# Patient Record
Sex: Male | Born: 1955 | Smoking: Former smoker
Health system: Southern US, Community
[De-identification: ages and names within clinical notes are randomized; demographics above are authoritative.]

## PROBLEM LIST (undated history)

## (undated) DIAGNOSIS — K859 Acute pancreatitis without necrosis or infection, unspecified: Secondary | ICD-10-CM

---

## 2013-05-02 ENCOUNTER — Encounter (HOSPITAL_COMMUNITY): Payer: Self-pay | Admitting: Emergency Medicine

## 2013-05-02 ENCOUNTER — Emergency Department (HOSPITAL_COMMUNITY)
Admission: EM | Admit: 2013-05-02 | Discharge: 2013-05-02 | Disposition: A | Discharge status: Home / Self Care | Payer: Non-veteran care | Attending: Emergency Medicine | Admitting: Emergency Medicine

## 2013-05-02 DIAGNOSIS — M7700 Medial epicondylitis, unspecified elbow: Secondary | ICD-10-CM | POA: Insufficient documentation

## 2013-05-02 DIAGNOSIS — M7702 Medial epicondylitis, left elbow: Secondary | ICD-10-CM

## 2013-05-02 LAB — CBC WITH DIFFERENTIAL/PLATELET
Basophils Absolute: 0 10*3/uL (ref 0.0–0.1)
Basophils Relative: 0 % (ref 0–1)
Eosinophils Absolute: 0.1 10*3/uL (ref 0.0–0.7)
Eosinophils Relative: 2 % (ref 0–5)
HCT: 43.5 % (ref 39.0–52.0)
Hemoglobin: 14.7 g/dL (ref 13.0–17.0)
Lymphocytes Relative: 33 % (ref 12–46)
Lymphs Abs: 1.7 10*3/uL (ref 0.7–4.0)
MCH: 31.1 pg (ref 26.0–34.0)
MCHC: 33.8 g/dL (ref 30.0–36.0)
MCV: 92.2 fL (ref 78.0–100.0)
Monocytes Absolute: 0.6 10*3/uL (ref 0.1–1.0)
Monocytes Relative: 11 % (ref 3–12)
Neutro Abs: 2.8 10*3/uL (ref 1.7–7.7)
Neutrophils Relative %: 54 % (ref 43–77)
Platelets: 155 10*3/uL (ref 150–400)
RBC: 4.72 MIL/uL (ref 4.22–5.81)
RDW: 13.1 % (ref 11.5–15.5)
WBC: 5.2 10*3/uL (ref 4.0–10.5)

## 2013-05-02 LAB — COMPREHENSIVE METABOLIC PANEL
ALT: 98 U/L — ABNORMAL HIGH (ref 0–53)
AST: 265 U/L — ABNORMAL HIGH (ref 0–37)
Albumin: 3.7 g/dL (ref 3.5–5.2)
Alkaline Phosphatase: 157 U/L — ABNORMAL HIGH (ref 39–117)
BUN: 19 mg/dL (ref 6–23)
CO2: 23 mEq/L (ref 19–32)
Calcium: 8.8 mg/dL (ref 8.4–10.5)
Chloride: 99 mEq/L (ref 96–112)
Creatinine, Ser: 0.97 mg/dL (ref 0.50–1.35)
GFR calc Af Amer: >90 mL/min (ref >90)
GFR calc non Af Amer: 90 mL/min — ABNORMAL LOW (ref >90)
Glucose, Bld: 130 mg/dL — ABNORMAL HIGH (ref 70–99)
Potassium: 4.1 mEq/L (ref 3.7–5.3)
Sodium: 139 mEq/L (ref 137–147)
Total Bilirubin: 0.4 mg/dL (ref 0.3–1.2)
Total Protein: 7.1 g/dL (ref 6.0–8.3)

## 2013-05-02 LAB — TROPONIN I: Troponin I: <0.3 ng/mL (ref <0.30)

## 2013-05-02 MED ORDER — IBUPROFEN 600 MG PO TABS: 600.0000 mg | ORAL_TABLET | Freq: Four times a day (QID) | ORAL | Status: DC | PRN

## 2013-05-02 NOTE — Discharge Instructions (Signed)
Medial Epicondylitis (Golfer's Elbow)  with Rehab  Medial epicondylitis involves inflammation and pain around the inner (medial) portion of the elbow. This pain is caused by inflammation of the tendons in the forearm that flex (bring down) the wrist. Medial epicondylitis is also called golfer's elbow, because it is common among golfers. However, it may occur in any individual who flexes the wrist regularly. If medial epicondylitis is left untreated, it may become a chronic problem.  SYMPTOMS   · Pain, tenderness, or inflammation over the inner (medial) side of the elbow.  · Pain or weakness with gripping activities.  · Pain that increases with wrist twisting motions (using a screwdriver, playing golf, bowling).  CAUSES   Medial epicondylitis is caused by inflammation of the tendons that flex the wrist. Causes of injury may include:  · Chronic, repetitive stress and strain to the tendons that run from the wrist and forearm to the elbow.  · Sudden strain on the forearm, including wrist snap when serving balls with racquet sports, or throwing a baseball.  RISK INCREASES WITH:  · Sports or occupations that require repetitive and/or strenuous forearm and wrist movements (pitching a baseball, golfing, carpentry).  · Poor wrist and forearm strength and flexibility.  · Failure to warm up properly before activity.  · Resuming activity before healing, rehabilitation, and conditioning are complete.  PREVENTION   · Warm up and stretch properly before activity.  · Maintain physical fitness:  · Strength, flexibility, and endurance.  · Cardiovascular fitness.  · Wear and use properly fitted equipment.  · Learn and use proper technique and have a coach correct improper technique.  · Wear a tennis elbow (counterforce) brace.  PROGNOSIS   The course of this condition depends on the degree of the injury. If treated properly, acute cases (symptoms lasting less than 4 weeks) are often resolved in 2 to 6 weeks. Chronic (longer lasting  cases) often resolve in 3 to 6 months, but may require physical therapy.  RELATED COMPLICATIONS   · Frequently recurring symptoms, resulting in a chronic problem. Properly treating the problem the first time decreases frequency of recurrence.  · Chronic inflammation, scarring, and partial tendon tear, requiring surgery.  · Delayed healing or resolution of symptoms.  TREATMENT   Treatment first involves the use of ice and medicine, to reduce pain and inflammation. Strengthening and stretching exercises may reduce discomfort, if performed regularly. These exercises may be performed at home, if the condition is an acute injury. Chronic cases may require a referral to a physical therapist for evaluation and treatment. Your caregiver may advise a corticosteroid injection to help reduce inflammation. Rarely, surgery is needed.  MEDICATION  · If pain medicine is needed, nonsteroidal anti-inflammatory medicines (aspirin and ibuprofen), or other minor pain relievers (acetaminophen), are often advised.  · Do not take pain medicine for 7 days before surgery.  · Prescription pain relievers may be given, if your caregiver thinks they are needed. Use only as directed and only as much as you need.  · Corticosteroid injections may be recommended. These injections should be reserved only for the most severe cases, because they can only be given a certain number of times.  HEAT AND COLD  · Cold treatment (icing) should be applied for 10 to 15 minutes every 2 to 3 hours for inflammation and pain, and immediately after activity that aggravates your symptoms. Use ice packs or an ice massage.  · Heat treatment may be used before performing stretching and strengthening activities   prescribed by your caregiver, physical therapist, or athletic trainer. Use a heat pack or a warm water soak.  SEEK MEDICAL CARE IF:  Symptoms get worse or do not improve in 2 weeks, despite treatment.  EXERCISES   RANGE OF MOTION (ROM) AND STRETCHING EXERCISES -  Epicondylitis, Medial (Golfer's Elbow)  These exercises may help you when beginning to rehabilitate your injury. Your symptoms may go away with or without further involvement from your physician, physical therapist or athletic trainer. While completing these exercises, remember:   · Restoring tissue flexibility helps normal motion to return to the joints. This allows healthier, less painful movement and activity.  · An effective stretch should be held for at least 30 seconds.  · A stretch should never be painful. You should only feel a gentle lengthening or release in the stretched tissue.  RANGE OF MOTION  Wrist Flexion, Active-Assisted  · Extend your right / left elbow with your fingers pointing down.*  · Gently pull the back of your hand towards you, until you feel a gentle stretch on the top of your forearm.  · Hold this position for __________ seconds.  Repeat __________ times. Complete this exercise __________ times per day.   *If directed by your physician, physical therapist or athletic trainer, complete this stretch with your elbow bent, rather than extended.  RANGE OF MOTION  Wrist Extension, Active-Assisted  · Extend your right / left elbow and turn your palm upwards.*  · Gently pull your palm and fingertips back, so your wrist extends and your fingers point more toward the ground.  · You should feel a gentle stretch on the inside of your forearm.  · Hold this position for __________ seconds.  Repeat __________ times. Complete this exercise __________ times per day.  *If directed by your physician, physical therapist or athletic trainer, complete this stretch with your elbow bent, rather than extended.  STRETCH  Wrist Extension   · Place your right / left fingertips on a tabletop leaving your elbow slightly bent. Your fingers should point backwards.  · Gently press your fingers and palm down onto the table, by straightening your elbow. You should feel a stretch on the inside of your forearm.  · Hold this  position for __________ seconds.  Repeat __________ times. Complete this stretch __________ times per day.   STRENGTHENING EXERCISES - Epicondylitis, Medial (Golfer's Elbow)  These exercises may help you when beginning to rehabilitate your injury. They may resolve your symptoms with or without further involvement from your physician, physical therapist or athletic trainer. While completing these exercises, remember:   · Muscles can gain both the endurance and the strength needed for everyday activities through controlled exercises.  · Complete these exercises as instructed by your physician, physical therapist or athletic trainer. Increase the resistance and repetitions only as guided.  · You may experience muscle soreness or fatigue, but the pain or discomfort you are trying to eliminate should never worsen during these exercises. If this pain does get worse, stop and make sure you are following the directions exactly. If the pain is still present after adjustments, discontinue the exercise until you can discuss the trouble with your caregiver.  STRENGTH Wrist Flexors  · Sit with your right / left forearm palm-up, and fully supported on a table or countertop. Your elbow should be resting below the height of your shoulder. Allow your wrist to extend over the edge of the surface.  · Loosely holding a __________ weight, or a piece   of rubber exercise band or tubing, slowly curl your hand up toward your forearm.  · Hold this position for __________ seconds. Slowly lower the wrist back to the starting position in a controlled manner.  Repeat __________ times. Complete this exercise __________ times per day.   STRENGTH  Wrist Extensors  · Sit with your right / left forearm palm-down and fully supported. Your elbow should be resting below the height of your shoulder. Allow your wrist to extend over the edge of the surface.  · Loosely holding a __________ weight, or a piece of rubber exercise band or tubing, slowly curl  your hand up toward your forearm.  · Hold this position for __________ seconds. Slowly lower the wrist back to the starting position in a controlled manner.  Repeat __________ times. Complete this exercise __________ times per day.   STRENGTH - Ulnar Deviators  · Stand with a ____________________ weight in your right / left hand, or sit while holding a rubber exercise band or tubing, with your healthy arm supported on a table or countertop.  · Move your wrist so that your pinkie travels toward your forearm and your thumb moves away from your forearm.  · Hold this position for __________ seconds and then slowly lower the wrist back to the starting position.  Repeat __________ times. Complete this exercise __________ times per day  STRENGTH - Grip   · Grasp a tennis ball, a dense sponge, or a large, rolled sock in your hand.  · Squeeze as hard as you can, without increasing any pain.  · Hold this position for __________ seconds. Release your grip slowly.  Repeat __________ times. Complete this exercise __________ times per day.   STRENGTH  Forearm Supinators   · Sit with your right / left forearm supported on a table, keeping your elbow below shoulder height. Rest your hand over the edge, palm down.  · Gently grip a hammer or a soup ladle.  · Without moving your elbow, slowly turn your palm and hand upward to a "thumbs-up" position.  · Hold this position for __________ seconds. Slowly return to the starting position.  Repeat __________ times. Complete this exercise __________ times per day.   STRENGTH  Forearm Pronators  · Sit with your right / left forearm supported on a table, keeping your elbow below shoulder height. Rest your hand over the edge, palm up.  · Gently grip a hammer or a soup ladle.  · Without moving your elbow, slowly turn your palm and hand upward to a "thumbs-up" position.  · Hold this position for __________ seconds. Slowly return to the starting position.  Repeat __________ times. Complete this  exercise __________ times per day.   Document Released: 01/04/2005 Document Revised: 03/29/2011 Document Reviewed: 04/18/2008  [employer]® Patient Information ©2014 [employer].

## 2013-05-02 NOTE — ED Notes (Signed)
The pt is here from out-of-town ti bury his father.

## 2013-05-02 NOTE — ED Notes (Signed)
The pt has had numbness in his lt arm since 0700am today.  He slept o the couch and he has a history of the same numbness but it usually goes away. He has some pain with movement also.  Speech clear gait steady no facial droop

## 2013-05-02 NOTE — ED Provider Notes (Signed)
CSN: [id number]     Arrival date & time 05/02/13  1722 History   First MD Initiated Contact with Patient 05/02/13 1814     Chief Complaint  Patient presents with  . Numbness     (Consider location/radiation/quality/duration/timing/severity/associated sxs/prior Treatment) HPI  This is a 58 y.o. male with no pertinent PMH presenting today with decreased sensation, numbness.  This has been going on for about a year now.  Deficit is located left small, ring fingers as well as medial aspect of the long finger.  It usually resolves after "shaking my arm."  However, it has persisted since 0700 today.  "I just can't feel as well."  No meds taken.  Radiates from medial aspect of elbow to aforementioned digits.  Positive for associated tenderness at the medial aspect of the left elbow.  Negative for HA, vision change, change in gait, change in speech, hearing change or weakness.  Negative for abdominal pain, CP, SOB, dizziness, or lightheadedness.  History reviewed. No pertinent past medical history. History reviewed. No pertinent past surgical history. No family history on file. History  Substance Use Topics  . Smoking status: Never Smoker   . Smokeless tobacco: Not on file  . Alcohol Use: Yes    Review of Systems  Constitutional: Negative for fever and chills.  HENT: Negative for facial swelling.   Eyes: Negative for pain and visual disturbance.  Respiratory: Negative for chest tightness and shortness of breath.   Cardiovascular: Negative for chest pain.  Gastrointestinal: Negative for nausea and vomiting.  Genitourinary: Negative for dysuria.  Musculoskeletal: Positive for arthralgias.  Neurological: Positive for numbness. Negative for headaches.  Psychiatric/Behavioral: Negative for behavioral problems.      Allergies  Review of patient's allergies indicates no known allergies.  Home Medications   Prior to Admission medications   Not on File   BP 131/66  Pulse 74  Temp(Src)  98.9 F (37.2 C)  Resp 18  Ht 6' (1.829 m)  Wt 252 lb (114.306 kg)  BMI 34.17 kg/m2  SpO2 99% Physical Exam  Constitutional: He is oriented to person, place, and time. He appears well-developed and well-nourished. No distress.  HENT:  Head: Normocephalic and atraumatic.  Mouth/Throat: No oropharyngeal exudate.  Eyes: Conjunctivae are normal. Pupils are equal, round, and reactive to light. No scleral icterus.  Neck: Normal range of motion. No tracheal deviation present. No thyromegaly present.  Cardiovascular: Normal rate, regular rhythm and normal heart sounds.  Exam reveals no gallop and no friction rub.   No murmur heard. Pulmonary/Chest: Effort normal and breath sounds normal. No stridor. No respiratory distress. He has no wheezes. He has no rales. He exhibits no tenderness.  Abdominal: Soft. He exhibits no distension and no mass. There is no tenderness. There is no rebound and no guarding.  Musculoskeletal: Normal range of motion. He exhibits no edema.  Neurological: He is alert and oriented to person, place, and time. He has normal strength. A sensory deficit (Decreased sensation in the distribution of the ulnar nerve from the elbow to the small, ring, and  long fingers) is present. No cranial nerve deficit. Coordination and gait normal. GCS eye subscore is 4. GCS verbal subscore is 5. GCS motor subscore is 6.  Reflex Scores:      Patellar reflexes are 2+ on the right side and 2+ on the left side. Skin: Skin is warm and dry. He is not diaphoretic.    ED Course  Procedures (including critical care time) Labs Review Labs  Reviewed  TROPONIN I    Imaging Review No results found.   EKG Interpretation   Date/Time:  Wednesday May 02 2013 18:00:05 EDT Ventricular Rate:  75 PR Interval:  158 QRS Duration: 94 QT Interval:  436 QTC Calculation: 486 R Axis:   57 Text Interpretation:  Normal sinus rhythm with sinus arrhythmia Abnormal  ECG Confirmed by Juleen ChinaKOHUT  MD, STEPHEN  ([id number]) on 05/02/2013 6:33:08 PM      MDM   Final diagnoses:  None    This is a 58 y.o. male with no pertinent PMH presenting today with decreased sensation, numbness.  This has been going on for about a year now.  Deficit is located left small, ring fingers as well as medial aspect of the long finger.  It usually resolves after "shaking my arm."  However, it has persisted since 0700 today.  "I just can't feel as well."  No meds taken.  Radiates from medial aspect of elbow to aforementioned digits.  Positive for associated tenderness at the medial aspect of the left elbow.  Negative for HA, vision change, change in gait, change in speech, hearing change or weakness.  Negative for abdominal pain, CP, SOB, dizziness, or lightheadedness.  Refer to above for EKG interpretation by Dr. Juleen ChinaKohut.  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  I do not believe this represents radiculopathy, ACS, PE, dissection, CVA, chest mass, or spinal lesion.  There is no further indication for further workup.  EKG is WNL and Mr. Jenean LindauRhodes has no ACS risk factors.  Pt stable for discharge, FU.  I have counseled him on epicondylitis as well as supportive care for such. All questions answered.  Return precautions given.  I have discussed case and care has been guided by my attending physician, Dr. Juleen ChinaKohut.  Loma BostonStirling Josselyn Harkins, MD 05/03/13 [phone]

## 2013-05-09 NOTE — ED Provider Notes (Signed)
I saw and evaluated the patient, reviewed the resident's note and I agree with the findings and plan.   EKG Interpretation   Date/Time:  Wednesday May 02 2013 18:00:05 EDT Ventricular Rate:  75 PR Interval:  158 QRS Duration: 94 QT Interval:  436 QTC Calculation: 486 R Axis:   57 Text Interpretation:  Normal sinus rhythm with sinus arrhythmia Abnormal  ECG Confirmed by Kalee Broxton  MD, Arnelle Nale ([id number]) on 05/02/2013 6:33:08 PM      58yM with L forearm/hand numbness. Clearly in ulnar nerve distribution.Neuro exam unremarkable. Very low suspicion for CVA. Plan antiinflammatories.   Raeford RazorStephen Marcellas Marchant, MD 05/09/13 [id number]

## 2013-07-11 ENCOUNTER — Inpatient Hospital Stay (HOSPITAL_COMMUNITY)
Admission: EM | Admit: 2013-07-11 | Discharge: 2013-07-14 | DRG: 439 | Disposition: A | Discharge status: Home / Self Care | Payer: Non-veteran care | Attending: Internal Medicine | Admitting: Internal Medicine

## 2013-07-11 ENCOUNTER — Emergency Department (HOSPITAL_COMMUNITY): Payer: Non-veteran care

## 2013-07-11 ENCOUNTER — Encounter (HOSPITAL_COMMUNITY): Payer: Self-pay | Admitting: Emergency Medicine

## 2013-07-11 DIAGNOSIS — G5622 Lesion of ulnar nerve, left upper limb: Secondary | ICD-10-CM

## 2013-07-11 DIAGNOSIS — K219 Gastro-esophageal reflux disease without esophagitis: Secondary | ICD-10-CM | POA: Diagnosis present

## 2013-07-11 DIAGNOSIS — Z87891 Personal history of nicotine dependence: Secondary | ICD-10-CM

## 2013-07-11 DIAGNOSIS — K859 Acute pancreatitis without necrosis or infection, unspecified: Principal | ICD-10-CM | POA: Diagnosis present

## 2013-07-11 DIAGNOSIS — K5900 Constipation, unspecified: Secondary | ICD-10-CM | POA: Diagnosis present

## 2013-07-11 DIAGNOSIS — E876 Hypokalemia: Secondary | ICD-10-CM | POA: Diagnosis not present

## 2013-07-11 DIAGNOSIS — F1020 Alcohol dependence, uncomplicated: Secondary | ICD-10-CM | POA: Diagnosis present

## 2013-07-11 DIAGNOSIS — F1010 Alcohol abuse, uncomplicated: Secondary | ICD-10-CM | POA: Diagnosis present

## 2013-07-11 DIAGNOSIS — E871 Hypo-osmolality and hyponatremia: Secondary | ICD-10-CM | POA: Diagnosis present

## 2013-07-11 DIAGNOSIS — K2920 Alcoholic gastritis without bleeding: Secondary | ICD-10-CM | POA: Diagnosis present

## 2013-07-11 DIAGNOSIS — G5620 Lesion of ulnar nerve, unspecified upper limb: Secondary | ICD-10-CM | POA: Diagnosis present

## 2013-07-11 DIAGNOSIS — I10 Essential (primary) hypertension: Secondary | ICD-10-CM | POA: Diagnosis present

## 2013-07-11 DIAGNOSIS — K852 Alcohol induced acute pancreatitis without necrosis or infection: Secondary | ICD-10-CM | POA: Diagnosis present

## 2013-07-11 HISTORY — DX: Acute pancreatitis without necrosis or infection, unspecified: K85.90

## 2013-07-11 LAB — URINALYSIS, ROUTINE W REFLEX MICROSCOPIC
Bilirubin Urine: NEGATIVE
GLUCOSE, UA: NEGATIVE mg/dL
KETONES UR: NEGATIVE mg/dL
Leukocytes, UA: NEGATIVE
Nitrite: NEGATIVE
PH: 6 (ref 5.0–8.0)
Protein, ur: NEGATIVE mg/dL
Specific Gravity, Urine: 1.021 (ref 1.005–1.030)
Urobilinogen, UA: 1 mg/dL (ref 0.0–1.0)

## 2013-07-11 LAB — CBC WITH DIFFERENTIAL/PLATELET
Basophils Absolute: 0 10*3/uL (ref 0.0–0.1)
Basophils Relative: 0 % (ref 0–1)
EOS PCT: 0 % (ref 0–5)
Eosinophils Absolute: 0 10*3/uL (ref 0.0–0.7)
HCT: 43.2 % (ref 39.0–52.0)
Hemoglobin: 15 g/dL (ref 13.0–17.0)
LYMPHS PCT: 11 % — AB (ref 12–46)
Lymphs Abs: 1 10*3/uL (ref 0.7–4.0)
MCH: 30.9 pg (ref 26.0–34.0)
MCHC: 34.7 g/dL (ref 30.0–36.0)
MCV: 89.1 fL (ref 78.0–100.0)
Monocytes Absolute: 1 10*3/uL (ref 0.1–1.0)
Monocytes Relative: 11 % (ref 3–12)
NEUTROS PCT: 78 % — AB (ref 43–77)
Neutro Abs: 7.3 10*3/uL (ref 1.7–7.7)
Platelets: 144 10*3/uL — ABNORMAL LOW (ref 150–400)
RBC: 4.85 MIL/uL (ref 4.22–5.81)
RDW: 13.2 % (ref 11.5–15.5)
WBC: 9.3 10*3/uL (ref 4.0–10.5)

## 2013-07-11 LAB — COMPREHENSIVE METABOLIC PANEL
ALK PHOS: 179 U/L — AB (ref 39–117)
ALT: 53 U/L (ref 0–53)
AST: 152 U/L — ABNORMAL HIGH (ref 0–37)
Albumin: 3.5 g/dL (ref 3.5–5.2)
BILIRUBIN TOTAL: 1.4 mg/dL — AB (ref 0.3–1.2)
BUN: 10 mg/dL (ref 6–23)
CHLORIDE: 97 meq/L (ref 96–112)
CO2: 23 mEq/L (ref 19–32)
Calcium: 9.2 mg/dL (ref 8.4–10.5)
Creatinine, Ser: 0.83 mg/dL (ref 0.50–1.35)
GLUCOSE: 141 mg/dL — AB (ref 70–99)
POTASSIUM: 4.4 meq/L (ref 3.7–5.3)
SODIUM: 135 meq/L — AB (ref 137–147)
Total Protein: 7.5 g/dL (ref 6.0–8.3)

## 2013-07-11 LAB — HEMOGLOBIN A1C
Hgb A1c MFr Bld: 6.2 % — ABNORMAL HIGH (ref <5.7)
MEAN PLASMA GLUCOSE: 131 mg/dL — AB (ref <117)

## 2013-07-11 LAB — CBC
HEMATOCRIT: 42.6 % (ref 39.0–52.0)
Hemoglobin: 15 g/dL (ref 13.0–17.0)
MCH: 32.1 pg (ref 26.0–34.0)
MCHC: 35.2 g/dL (ref 30.0–36.0)
MCV: 91 fL (ref 78.0–100.0)
PLATELETS: 127 10*3/uL — AB (ref 150–400)
RBC: 4.68 MIL/uL (ref 4.22–5.81)
RDW: 13.5 % (ref 11.5–15.5)
WBC: 8.5 10*3/uL (ref 4.0–10.5)

## 2013-07-11 LAB — RAPID URINE DRUG SCREEN, HOSP PERFORMED
Amphetamines: NOT DETECTED
BARBITURATES: NOT DETECTED
Benzodiazepines: NOT DETECTED
Cocaine: NOT DETECTED
OPIATES: NOT DETECTED
TETRAHYDROCANNABINOL: POSITIVE — AB

## 2013-07-11 LAB — I-STAT CG4 LACTIC ACID, ED: Lactic Acid, Venous: 2.11 mmol/L (ref 0.5–2.2)

## 2013-07-11 LAB — LIPID PANEL
CHOL/HDL RATIO: 6.6 ratio
CHOLESTEROL: 164 mg/dL (ref 0–200)
HDL: 25 mg/dL — ABNORMAL LOW (ref >39)
LDL CALC: 92 mg/dL (ref 0–99)
Triglycerides: 235 mg/dL — ABNORMAL HIGH (ref <150)
VLDL: 47 mg/dL — AB (ref 0–40)

## 2013-07-11 LAB — CREATININE, SERUM
Creatinine, Ser: 0.86 mg/dL (ref 0.50–1.35)
GFR calc Af Amer: >90 mL/min (ref >90)

## 2013-07-11 LAB — URINE MICROSCOPIC-ADD ON

## 2013-07-11 LAB — LIPASE, BLOOD: Lipase: 536 U/L — ABNORMAL HIGH (ref 11–59)

## 2013-07-11 LAB — ETHANOL
Alcohol, Ethyl (B): <11 mg/dL (ref 0–11)
Alcohol, Ethyl (B): <11 mg/dL (ref 0–11)

## 2013-07-11 MED ORDER — ENOXAPARIN SODIUM 40 MG/0.4ML ~~LOC~~ SOLN
40.0000 mg | SUBCUTANEOUS | Status: DC
  Administered 2013-07-11 – 2013-07-13 (×3): 40 mg via SUBCUTANEOUS
  Filled 2013-07-11 (×4): qty 0.4

## 2013-07-11 MED ORDER — SODIUM CHLORIDE 0.9 % IV SOLN
INTRAVENOUS | Status: DC
  Administered 2013-07-11 – 2013-07-13 (×7): via INTRAVENOUS

## 2013-07-11 MED ORDER — HYDROMORPHONE HCL PF 1 MG/ML IJ SOLN
1.0000 mg | Freq: Once | INTRAMUSCULAR | Status: AC
  Administered 2013-07-11: 1 mg via INTRAVENOUS
  Filled 2013-07-11: qty 1

## 2013-07-11 MED ORDER — HYDROCODONE-ACETAMINOPHEN 5-325 MG PO TABS
2.0000 | ORAL_TABLET | ORAL | Status: DC | PRN
  Administered 2013-07-11 – 2013-07-12 (×4): 2 via ORAL
  Filled 2013-07-11 (×5): qty 2

## 2013-07-11 MED ORDER — THIAMINE HCL 100 MG/ML IJ SOLN
100.0000 mg | Freq: Every day | INTRAMUSCULAR | Status: DC
  Administered 2013-07-13: 100 mg via INTRAVENOUS
  Filled 2013-07-11 (×3): qty 1

## 2013-07-11 MED ORDER — ONDANSETRON HCL 4 MG/2ML IJ SOLN
4.0000 mg | Freq: Once | INTRAMUSCULAR | Status: AC
  Administered 2013-07-11: 4 mg via INTRAVENOUS
  Filled 2013-07-11: qty 2

## 2013-07-11 MED ORDER — LORAZEPAM 2 MG/ML IJ SOLN: 1.0000 mg | Freq: Four times a day (QID) | INTRAMUSCULAR | Status: DC | PRN

## 2013-07-11 MED ORDER — ADULT MULTIVITAMIN W/MINERALS CH
1.0000 | ORAL_TABLET | Freq: Every day | ORAL | Status: DC
  Administered 2013-07-11 – 2013-07-14 (×4): 1 via ORAL
  Filled 2013-07-11 (×4): qty 1

## 2013-07-11 MED ORDER — CHLORTHALIDONE 25 MG PO TABS
25.0000 mg | ORAL_TABLET | Freq: Every day | ORAL | Status: DC
  Administered 2013-07-12 – 2013-07-14 (×3): 25 mg via ORAL
  Filled 2013-07-11 (×6): qty 1

## 2013-07-11 MED ORDER — FOLIC ACID 1 MG PO TABS
1.0000 mg | ORAL_TABLET | Freq: Every day | ORAL | Status: DC
  Administered 2013-07-11 – 2013-07-14 (×4): 1 mg via ORAL
  Filled 2013-07-11 (×4): qty 1

## 2013-07-11 MED ORDER — ONDANSETRON HCL 4 MG/2ML IJ SOLN: 4.0000 mg | Freq: Four times a day (QID) | INTRAMUSCULAR | Status: DC | PRN

## 2013-07-11 MED ORDER — SODIUM CHLORIDE 0.9 % IV BOLUS (SEPSIS)
500.0000 mL | Freq: Once | INTRAVENOUS | Status: AC
  Administered 2013-07-11: 500 mL via INTRAVENOUS

## 2013-07-11 MED ORDER — ONDANSETRON HCL 4 MG PO TABS
4.0000 mg | ORAL_TABLET | Freq: Four times a day (QID) | ORAL | Status: DC | PRN
  Administered 2013-07-12: 4 mg via ORAL
  Filled 2013-07-11: qty 1

## 2013-07-11 MED ORDER — VITAMIN B-1 100 MG PO TABS
100.0000 mg | ORAL_TABLET | Freq: Every day | ORAL | Status: DC
  Administered 2013-07-11 – 2013-07-14 (×3): 100 mg via ORAL
  Filled 2013-07-11 (×4): qty 1

## 2013-07-11 MED ORDER — HYDROMORPHONE HCL PF 1 MG/ML IJ SOLN
1.0000 mg | INTRAMUSCULAR | Status: AC | PRN
  Administered 2013-07-11 – 2013-07-12 (×4): 1 mg via INTRAVENOUS
  Filled 2013-07-11 (×4): qty 1

## 2013-07-11 MED ORDER — LORAZEPAM 1 MG PO TABS
1.0000 mg | ORAL_TABLET | Freq: Four times a day (QID) | ORAL | Status: AC | PRN
Start: 2013-07-11 — End: 2013-07-14

## 2013-07-11 NOTE — ED Notes (Signed)
Dr. Pollina at bedside   

## 2013-07-11 NOTE — H&P (Signed)
Date: 07/11/2013               Patient Name:  Brandon Camacho MRN: [id number]  DOB: 09-23-1955 Age / Sex: 58 y.o., male   PCP: No primary provider on file.         Medical Service: Internal Medicine Teaching Service         Attending Physician: Dr. Inez CatalinaEmily B Mullen, MD    First Contact: Dr. Clyde CanterburyE. Woody Jones Pager: [phone]  Second Contact: Dr. Burr Medico. Kazibwe Pager: [phone]       After Hours (After 5p/  First Contact Pager: [phone]  weekends / holidays): Second Contact Pager: [phone]   Chief Complaint: abdominal pain  History of Present Illness: Brandon Camacho is a 58 yo with history significant for hypertension, alcohol abuse, and prior alcoholic pancreatitis (2013) who presents to [facility] ED with complaints of abdominal pain for the past 2 days.  He reports that an achy, dull left upper quadrant pain is continuous and associated with nausea and vomiting if he "tries to eat".  [place] from [place] to handle the estate of his father who is recently deceased.  [place] [facility] and [facility] clinic in [place], [place](Kara Puti, MD-PCP). States that he is a daily drinker of beer and vodka.  Had difficulty quantifying but states "its the amount served in a bar, at least an ounce". Denies fever, vomiting blood, dark or bloody stools, diarrhea, seizures, syncopal episodes, chest pain or palpitations.  States that he has been without blood pressure medicine for some time because it was only "borderline" high.  Former smoker quit Nov 2014 Married, kids, full code Denies illicit drugs  Meds: Current Facility-Administered Medications  Medication Dose Route Frequency Provider Last Rate Last Dose  . chlorthalidone (HYGROTON) tablet 25 mg  25 mg Oral Daily Manuela SchwartzKaren-Akosua M Schooler, MD      . HYDROmorphone (DILAUDID) injection 1 mg  1 mg Intravenous Q4H PRN Manuela SchwartzKaren-Akosua M Schooler, MD       Current Outpatient  Prescriptions  Medication Sig Dispense Refill  . ibuprofen (ADVIL,MOTRIN) 200 MG tablet Take 400 mg by mouth every 6 (six) hours as needed (pain).        Allergies: Allergies as of 07/11/2013  . (No Known Allergies)   Past Medical History  Diagnosis Date  . Pancreatitis    History reviewed. No pertinent past surgical history. History reviewed. No pertinent family history. History   Social History  . Marital Status: Unknown    Spouse Name: N/A    Number of Children: N/A  . Years of Education: N/A   Occupational History  . Not on file.   Social History Main Topics  . Smoking status: Former Smoker    Quit date: 12/11/2012  . Smokeless tobacco: Not on file  . Alcohol Use: Yes  . Drug Use: No  . Sexual Activity: Not on file   Other Topics Concern  . Not on file   Social History Narrative   Veteran from [place], usually receives care at [facility] [place] and Satellite clinic [place], [place] (Dr. Alfonso PattenPuti)     Review of Systems: Constitutional:  Denies fever, chills, diaphoresis, appetite change and fatigue.  HEENT: Denies congestion, rhinorrhea or cold-like sx.  Respiratory: Denies SOB, DOE, cough, chest tightness, and wheezing.  Cardiovascular: Denies chest pain, palpitations and leg swelling.  Gastrointestinal: Endorses nausea, vomiting after eating "a  cracker", abdominal pain, Denies diarrhea, constipation, blood in stool and abdominal distention.  Genitourinary: Denies dysuria, hematuria, flank pain and difficulty urinating.  Musculoskeletal: Denies myalgias, back pain, joint swelling, arthralgias and gait problem.   Skin: Denies pallor, rash and wound.  Neurological: Reports ulnar compression with weakness of left 4th and 5th finger, waiting for EEG approval per [facility], Denies dizziness, seizures, syncope, weakness, light-headedness,  and headaches.   Hematological: Denies easy bruising  Psychiatric/ Behavioral: Denies confusion and agitation.     Physical  Exam: Blood pressure 175/94, pulse 69, temperature 98.6 F (37 C), temperature source Oral, resp. rate 13, height 6' (1.829 m), weight 240 lb (108.863 kg), SpO2 96.00%. General: Well-developed, well-nourished, AA male, in no acute distress; wife at bedside HEENT: Normocephalic, atraumatic, PERRLA, EOMI, murky sclera, Moist mucous membranes, supple neck, no carotid bruits, no JVD appreciated. Lungs: Normal respiratory effort. Clear to auscultation bilaterally from apices to bases without crackles or wheezes appreciated. Heart: normal rate, regular rhythm, normal S1 and S2, no gallop, murmur, or rubs appreciated. Abdomen: BS normoactive. Soft, Nondistended, tender to palpation epigastrium and LUQ, no guarding, no rebound  Extremities: No pretibial edema, distal pulses intact Neurologic: grossly non-focal, alert and oriented x3, appropriate and cooperative throughout examination. Strength 5/5 BUE and BLE   Lab results: Basic Metabolic Panel:  Recent Labs  16/10/96 0835  NA 135*  K 4.4  CL 97  CO2 23  GLUCOSE 141*  BUN 10  CREATININE 0.83  CALCIUM 9.2   Liver Function Tests:  Recent Labs  07/11/13 0835  AST 152*  ALT 53  ALKPHOS 179*  BILITOT 1.4*  PROT 7.5  ALBUMIN 3.5    Recent Labs  07/11/13 0835  LIPASE 536*   CBC:  Recent Labs  07/11/13 0835  WBC 9.3  NEUTROABS 7.3  HGB 15.0  HCT 43.2  MCV 89.1  PLT 144*   Urine Drug Screen: Drugs of Abuse  No results found for this basename: labopia, cocainscrnur, labbenz, amphetmu, thcu, labbarb    Urinalysis:  Recent Labs  07/11/13 0938  COLORURINE AMBER*  LABSPEC 1.021  PHURINE 6.0  GLUCOSEU NEGATIVE  HGBUR TRACE*  BILIRUBINUR NEGATIVE  KETONESUR NEGATIVE  PROTEINUR NEGATIVE  UROBILINOGEN 1.0  NITRITE NEGATIVE  LEUKOCYTESUR NEGATIVE    Imaging results:  Dg Abd Acute W/chest  07/11/2013   CLINICAL DATA:  Abdominal pain.  EXAM: ACUTE ABDOMEN SERIES (ABDOMEN 2 VIEW & CHEST 1 VIEW)  COMPARISON:  None.   FINDINGS: Soft tissues unremarkable. Calcifications and pelvis consistent with phleboliths. Gas pattern nonspecific. No bowel distention or free air.  Chest x-ray is unremarkable.  IMPRESSION: Negative abdominal radiographs.  No acute cardiopulmonary disease.   Electronically Signed   By: Maisie Fus  Register   On: 07/11/2013 09:08    Other results: EKG: normal sinus rhythm, prolonged QTc increased from .  Assessment & Plan by Problem: Mr. Zajicek is a 57 yo admitted with abdominal pain in setting of elevated lipase and alcohol abuse concerning for alcoholic pancreatitis.  #1 Acute Alcoholic Pancreatitis: recurrent with prior episode 2013, lipase 536. Consumes daily beer and hard liquor. Hemodynamically stable, No significant electrolytes abnormalities, normal calcium, slightly hyponatremic. Will manage volume status with IV fluids and analgesics. Pt received 2L NS in ED. Currently patient is fairly comfortable on exam and tolerating sips. -cont IV fluids 150 cc/h, monitor I&Os -NPO except sips with meds -IV Dilaudid 1 mg q4h prn x 4 doses -Hydrocodone 5-10 mg q4h prn breatkthrough pain   #2  Hypertension: bp 175/94 69 bpm, Pt states that he has only been "borderline" but does admit to taking antihypertensives in the past and has not refilled for some time.  Patient cannot recall prior medication names. Will start thiazide given no known Diabetes or CKD. -chlorthalidone 25 mg qd -check UDS -check HgbA1c   #3 Alcohol Abuse: Patient dose not think that he has a problem with drinking alcohol, no motivation to stop at this point. Patient counseled for cessation. -CIWA -offer information and resources for ETOH cessation during hospitalization, will attach additional info to Discharge Information  DVT ppx: Lovenox SQ FULL CODE  Dispo: Disposition is deferred at this time, awaiting improvement of current medical problems. Anticipated discharge in approximately 1-3 day(s).   The patient  does have a current PCP (No primary provider on file.) and does not need an [facility] hospital follow-up appointment after discharge.  The patient does not have transportation limitations that hinder transportation to clinic appointments.  Signed: Manuela SchwartzKaren-Akosua M Schooler, MD 07/11/2013, 12:07 PM

## 2013-07-11 NOTE — ED Notes (Signed)
Reports left side abd pain x 2 days with n/v. Hx of pancreatitis.

## 2013-07-11 NOTE — ED Provider Notes (Signed)
CSN: [id number]     Arrival date & time 07/11/13  [id number] History   First MD Initiated Contact with Patient 07/11/13 [phone]     Chief Complaint  Patient presents with  . Abdominal Pain     (Consider location/radiation/quality/duration/timing/severity/associated sxs/prior Treatment) HPI Comments: Patient presents to the ER for evaluation of abdominal pain. Patient reports that symptoms began 2 days ago. Patient now experiencing severe pain in the left upper abdomen with nausea and vomiting. Vomiting began this morning. Patient has not had any fever. There is no diarrhea or constipation. Patient reports a previous history of pancreatitis with similar symptoms. Patient indicates that his back it is in the process secondary to alcohol. He admits to continuing drinking.  Patient is a 58 y.o. male presenting with abdominal pain.  Abdominal Pain Associated symptoms: nausea and vomiting   Associated symptoms: no fever     Past Medical History  Diagnosis Date  . Pancreatitis    History reviewed. No pertinent past surgical history. History reviewed. No pertinent family history. History  Substance Use Topics  . Smoking status: Current Every Day Smoker  . Smokeless tobacco: Not on file  . Alcohol Use: Yes    Review of Systems  Constitutional: Negative for fever.  Gastrointestinal: Positive for nausea, vomiting and abdominal pain.  All other systems reviewed and are negative.     Allergies  Review of patient's allergies indicates no known allergies.  Home Medications   Prior to Admission medications   Medication Sig Start Date End Date Taking? Authorizing Provider  ibuprofen (ADVIL,MOTRIN) 200 MG tablet Take 400 mg by mouth every 6 (six) hours as needed (pain).   Yes Historical Provider, MD   BP 191/85  Pulse 65  Temp(Src) 98.6 F (37 C) (Oral)  Resp 15  Ht 6' (1.829 m)  Wt 240 lb (108.863 kg)  BMI 32.54 kg/m2  SpO2 99% Physical Exam  Constitutional: He is oriented to person,  place, and time. He appears well-developed and well-nourished. He appears distressed.  HENT:  Head: Normocephalic and atraumatic.  Right Ear: Hearing normal.  Left Ear: Hearing normal.  Nose: Nose normal.  Mouth/Throat: Oropharynx is clear and moist and mucous membranes are normal.  Eyes: Conjunctivae and EOM are normal. Pupils are equal, round, and reactive to light.  Neck: Normal range of motion. Neck supple.  Cardiovascular: Regular rhythm, S1 normal and S2 normal.  Exam reveals no gallop and no friction rub.   No murmur heard. Pulmonary/Chest: Effort normal and breath sounds normal. No respiratory distress. He exhibits no tenderness.  Abdominal: Soft. Normal appearance and bowel sounds are normal. There is no hepatosplenomegaly. There is tenderness in the left upper quadrant. There is no rebound, no guarding, no tenderness at McBurney's point and negative Murphy's sign. No hernia.  Musculoskeletal: Normal range of motion.  Neurological: He is alert and oriented to person, place, and time. He has normal strength. No cranial nerve deficit or sensory deficit. Coordination normal. GCS eye subscore is 4. GCS verbal subscore is 5. GCS motor subscore is 6.  Skin: Skin is warm, dry and intact. No rash noted. No cyanosis.  Psychiatric: He has a normal mood and affect. His speech is normal and behavior is normal. Thought content normal.    ED Course  Procedures (including critical care time) Labs Review Labs Reviewed  CBC WITH DIFFERENTIAL - Abnormal; Notable for the following:    Platelets 144 (*)    Neutrophils Relative % 78 (*)    Lymphocytes Relative  11 (*)    All other components within normal limits  COMPREHENSIVE METABOLIC PANEL - Abnormal; Notable for the following:    Sodium 135 (*)    Glucose, Bld 141 (*)    AST 152 (*)    Alkaline Phosphatase 179 (*)    Total Bilirubin 1.4 (*)    All other components within normal limits  LIPASE, BLOOD - Abnormal; Notable for the following:     Lipase 536 (*)    All other components within normal limits  URINALYSIS, ROUTINE W REFLEX MICROSCOPIC - Abnormal; Notable for the following:    Color, Urine AMBER (*)    APPearance CLOUDY (*)    Hgb urine dipstick TRACE (*)    All other components within normal limits  URINE MICROSCOPIC-ADD ON - Abnormal; Notable for the following:    Casts HYALINE CASTS (*)    All other components within normal limits  I-STAT CG4 LACTIC ACID, ED    Imaging Review Dg Abd Acute W/chest  07/11/2013   CLINICAL DATA:  Abdominal pain.  EXAM: ACUTE ABDOMEN SERIES (ABDOMEN 2 VIEW & CHEST 1 VIEW)  COMPARISON:  None.  FINDINGS: Soft tissues unremarkable. Calcifications and pelvis consistent with phleboliths. Gas pattern nonspecific. No bowel distention or free air.  Chest x-ray is unremarkable.  IMPRESSION: Negative abdominal radiographs.  No acute cardiopulmonary disease.   Electronically Signed   By: Maisie Fushomas  Register   On: 07/11/2013 09:08     EKG Interpretation   Date/Time:  Wednesday July 11 2013 08:40:03 EDT Ventricular Rate:  66 PR Interval:  148 QRS Duration: 88 QT Interval:  486 QTC Calculation: 509 R Axis:   11 Text Interpretation:  Sinus rhythm Prolonged QT interval No significant  change since last tracing Confirmed by POLLINA  MD, CHRISTOPHER ([id number]) on  07/11/2013 9:07:02 AM      MDM   Final diagnoses:  None   acute pancreatitis  Patient presents to the ER for evaluation of left-sided abdominal pain. Patient has a previous history of alcoholic pancreatitis, he admits to continuing drinking. Patient has tenderness in the left quadrant without signs of peritonitis. CBC is unremarkable. Does have mildly elevated AST and alkaline phosphatase, felt to be secondary to his alcoholic intake. Lipase is elevated at 536. This is consistent with recurrent pancreatitis. Patient requiring multiple doses of analgesia here in the ER, will be admitted for further management.    Gilda Creasehristopher J.  Pollina, MD 07/11/13 [phone]

## 2013-07-12 DIAGNOSIS — G5620 Lesion of ulnar nerve, unspecified upper limb: Secondary | ICD-10-CM

## 2013-07-12 LAB — BASIC METABOLIC PANEL
BUN: 8 mg/dL (ref 6–23)
CALCIUM: 8.9 mg/dL (ref 8.4–10.5)
CO2: 25 meq/L (ref 19–32)
CREATININE: 0.89 mg/dL (ref 0.50–1.35)
Chloride: 97 mEq/L (ref 96–112)
GFR calc Af Amer: >90 mL/min (ref >90)
GFR calc non Af Amer: >90 mL/min (ref >90)
Glucose, Bld: 103 mg/dL — ABNORMAL HIGH (ref 70–99)
Potassium: 3.4 mEq/L — ABNORMAL LOW (ref 3.7–5.3)
Sodium: 136 mEq/L — ABNORMAL LOW (ref 137–147)

## 2013-07-12 LAB — CBC
HEMATOCRIT: 43.3 % (ref 39.0–52.0)
Hemoglobin: 14.5 g/dL (ref 13.0–17.0)
MCH: 31.3 pg (ref 26.0–34.0)
MCHC: 33.5 g/dL (ref 30.0–36.0)
MCV: 93.5 fL (ref 78.0–100.0)
Platelets: 124 10*3/uL — ABNORMAL LOW (ref 150–400)
RBC: 4.63 MIL/uL (ref 4.22–5.81)
RDW: 13.9 % (ref 11.5–15.5)
WBC: 18.1 10*3/uL — ABNORMAL HIGH (ref 4.0–10.5)

## 2013-07-12 MED ORDER — POTASSIUM CHLORIDE 10 MEQ/100ML IV SOLN
10.0000 meq | INTRAVENOUS | Status: AC
  Administered 2013-07-12 (×4): 10 meq via INTRAVENOUS
  Filled 2013-07-12 (×4): qty 100

## 2013-07-12 MED ORDER — PANTOPRAZOLE SODIUM 40 MG IV SOLR
40.0000 mg | Freq: Every day | INTRAVENOUS | Status: DC
  Administered 2013-07-12 – 2013-07-13 (×2): 40 mg via INTRAVENOUS
  Filled 2013-07-12 (×3): qty 40

## 2013-07-12 MED ORDER — FLEET ENEMA 7-19 GM/118ML RE ENEM
1.0000 | ENEMA | Freq: Once | RECTAL | Status: AC
  Administered 2013-07-12: 20:00:00 via RECTAL
  Filled 2013-07-12 (×2): qty 1

## 2013-07-12 MED ORDER — PROMETHAZINE HCL 25 MG/ML IJ SOLN: 25.0000 mg | Freq: Four times a day (QID) | INTRAMUSCULAR | Status: DC | PRN

## 2013-07-12 MED ORDER — HYDROMORPHONE HCL PF 1 MG/ML IJ SOLN
1.0000 mg | INTRAMUSCULAR | Status: DC | PRN
  Administered 2013-07-12 (×3): 1 mg via INTRAVENOUS
  Filled 2013-07-12 (×3): qty 1

## 2013-07-12 MED ORDER — HYDROMORPHONE HCL PF 1 MG/ML IJ SOLN
1.0000 mg | INTRAMUSCULAR | Status: DC | PRN
Start: 2013-07-12 — End: 2013-07-13
  Administered 2013-07-12 – 2013-07-13 (×5): 1 mg via INTRAVENOUS
  Filled 2013-07-12 (×5): qty 1

## 2013-07-12 NOTE — H&P (Signed)
  Date: 07/12/2013  Patient name: Brandon Camacho  Medical record number: [id number]  Date of birth: [birth year]   I have seen and evaluated Brandon Idolerry Hamidi and discussed their care with the Residency Team.  Briefly, Mr. Jenean LindauRhodes is a 58yo man with PMH of HTN, ETOH abuse and pancreatitis in 2013 who presents with abdominal pain, LUQ, achy and associated with nausea and vomiting.  He is from [place] and on Monday he was noted to eat [employer] after which he developed a mild pain that gradually worsened.  The next morning he and his wife traveled from [place] as his father recently died.  On day of admission, his symptoms became unbearable and he presented.  He is an every day drinker of beer and vodka.  He has had no hemetemesis or melena, no syncope, chest pain.  He further notes decreased bowel movements and passage of flatus.  On exam, he is alert and awake, RR, NR, no murmur.  Abdomen is tender to palpation, worse in the left quadrants and with some voluntary guarding, no distention.  Legs are not edematous.  Labs revealed a mildly low Na, elevated AST to 152 and a lipase of 536.  Normal WBC.  AXR was unremarkable.    Assessment and Plan: I have seen and evaluated the patient as outlined above. I agree with the formulated Assessment and Plan as detailed in the residents' admission note, with the following changes:   1. Acute alcoholic pancreatitis - IVF with NS - NPO x meds - IV dilaudid while not taking PO, hydrocodone/tylenol for breakthrough if tolerated - Consider phenergan for nausea given prolonged QTc on EKG.  - Advance diet as tolerated - CIWA  2. HTN - Start chlorthalidone as noted in resident note  [facility] will [facility] [place] on his return home.   Inez CatalinaEmily B Mullen, MD 6/25/20152:09 PM

## 2013-07-12 NOTE — Discharge Summary (Signed)
Name: Brandon Camacho MRN: [id number] DOB: February 03, 1955 58 y.o. PCP: No primary provider on file. _________________________________________________  Date of Admission: 07/11/2013  8:17 AM Date of Discharge: 07/14/2013 Attending Physician: Inez CatalinaEmily B Mullen, MD   Discharge Diagnosis: Acute recurrent pancreatitis  Hypertension  Alcohol Abuse  Constipation    Discharge Medications:   Medication List    STOP taking these medications       ibuprofen 200 MG tablet  Commonly known as:  ADVIL,MOTRIN      TAKE these medications       chlorthalidone 25 MG tablet  Commonly known as:  HYGROTON  Take 1 tablet (25 mg total) by mouth daily.     HYDROcodone-acetaminophen 5-325 MG per tablet  Commonly known as:  NORCO/VICODIN  Take 1-2 tablets by mouth every 6 (six) hours as needed for moderate pain.     multivitamin with minerals Tabs tablet  Take 1 tablet by mouth daily.     pantoprazole 40 MG tablet  Commonly known as:  PROTONIX  Take 1 tablet (40 mg total) by mouth daily.        Disposition and follow-up:   BrandonWinfrey Brandon Camacho was [facility] in good condition to home.  Please address the following problems post-discharge:  1.Resolution of abdominal pain, pancreatitis 2.Counseling for alcohol cessation 3.BP control 4.Health maintenance 5.Resolution of constipation   Labs / imaging needed at time of follow-up: BMP, cbc  Pending labs/ test needing follow-up: None Follow-up Appointments: Please call your primary care physician for a hospital follow-up appointment as soon as possible.    Discharge Instructions: Discharge Instructions   Call MD for:  persistant nausea and vomiting    Complete by:  As directed      Call MD for:  severe uncontrolled pain    Complete by:  As directed      Diet - low sodium heart healthy    Complete by:  As directed      Discharge instructions    Complete by:  As directed   Please follow-up with your primary  care physician.     Increase activity slowly    Complete by:  As directed            Consultations:  None  Procedures Performed:  Dg Abd Acute W/chest  07/11/2013   CLINICAL DATA:  Abdominal pain.  EXAM: ACUTE ABDOMEN SERIES (ABDOMEN 2 VIEW & CHEST 1 VIEW)  COMPARISON:  None.  FINDINGS: Soft tissues unremarkable. Calcifications and pelvis consistent with phleboliths. Gas pattern nonspecific. No bowel distention or free air.  Chest x-ray is unremarkable.  IMPRESSION: Negative abdominal radiographs.  No acute cardiopulmonary disease.   Electronically Signed   By: Brandon Camacho  Register   On: 07/11/2013 09:08    2D Echo: N/A  Cardiac Cath: N/A  Admission HPI:  Mr. Brandon Camacho is a 58 yo with history significant for hypertension, alcohol abuse, and prior alcoholic pancreatitis (2013) who presents [facility] ED with complaints of abdominal pain for the past 2 days. He reports that an achy, dull left upper quadrant pain is continuous and associated with nausea and vomiting if he "tries to eat". He [place] from [place] to handle the estate of his father who is recently deceased. [place] [facility] [place] [facility] and [facility] clinic in [place], [place](Kara Puti, MD-PCP).  States that he is a daily drinker of beer and vodka. Had difficulty quantifying but  states "its the amount served in a bar, at least an ounce". Denies fever, vomiting blood, dark or bloody stools, diarrhea, seizures, syncopal episodes, chest pain or palpitations. States that he has been without blood pressure medicine for some time because it was only "borderline" high.  Former smoker quit Nov 2014  Married, kids, full code  Denies illicit drugs  Manuela Schwartz, MD  Hospital Course by problem list: Active Problems:   Acute alcoholic pancreatitis   Alcohol abuse   Hypertension   Ulnar nerve compression   Acute recurrent pancreatitis  Pt visiting from  [place] admitted with abdominal pain associated with N/V with lipase of 536.  Had a prior episode of pancreatitis in 2013.   Consumes daily beer and hard liquor. Hemodynamically stable, without significant electrolytes abnormalities, normal calcium, slightly hyponatremic and hypokalemic (repleted).  He was kept NPO and given IVF and IV analgesics until resolution of pain and able to tolerate food.  Also with leukocytosis likely d/t inflammation. VSS without tachycardia, afebrile. Alcoholic gastritis may also be contributing to pain as pt reports increased belching and hiccups.  He was started on protonix daily for possible gastritis.  His diet was advanced slowly and was tolerating a regular diet on the day of discharge.  He should follow-up with the [place] in [place].    Hypertension Pt reports having "borderline" HTN but does admit to taking antihypertensives in the past and has not refilled for some time and cannot recall names. Began chlorthalidone 25mg  daily during hospitalization.  Pain may also be contributing. May need to consider adding another agent pending resolution of pancreatitis. HA1c: 6.2. LP with elevated trig: 235 and low HDL: 25. LDL: 92.   Alcohol Abuse  Pt does not think that he has a problem with drinking alcohol, no motivation to stop at this point. Counseled for cessation. Also with elevated AST. Placed on CIWA and required no ativan.   Constipation  Pt with no BM since Monday and is not passing gas. Ab XR neg.  He was given enema with no BM.  Also given dose of miralax on day of discharge.     -water tap enema Discharge Vitals:   BP 140/74  Pulse 92  Temp(Src) 98.8 F (37.1 C) (Oral)  Resp 17  Ht 6' (1.829 m)  Wt 238 lb 5.1 oz (108.1 kg)  BMI 32.31 kg/m2  SpO2 99%  Discharge Labs:  Results for orders placed during the hospital encounter of 07/11/13 (from the past 24 hour(s))  CBC     Status: Abnormal   Collection Time    07/14/13  4:42 AM      Result Value Ref Range     WBC 13.8 (*) 4.0 - 10.5 K/uL   RBC 4.07 (*) 4.22 - 5.81 MIL/uL   Hemoglobin 12.8 (*) 13.0 - 17.0 g/dL   HCT 16.1 (*) 09.6 - 04.5 %   MCV 93.6  78.0 - 100.0 fL   MCH 31.4  26.0 - 34.0 pg   MCHC 33.6  30.0 - 36.0 g/dL   RDW 40.9  81.1 - 91.4 %   Platelets 125 (*) 150 - 400 K/uL  COMPREHENSIVE METABOLIC PANEL     Status: Abnormal   Collection Time    07/14/13  4:42 AM      Result Value Ref Range   Sodium 136 (*) 137 - 147 mEq/L   Potassium 3.2 (*) 3.7 - 5.3 mEq/L   Chloride 96  96 - 112 mEq/L   CO2  28  19 - 32 mEq/L   Glucose, Bld 89  70 - 99 mg/dL   BUN 10  6 - 23 mg/dL   Creatinine, Ser 1.610.77  0.50 - 1.35 mg/dL   Calcium 8.5  8.4 - 09.610.5 mg/dL   Total Protein 6.1  6.0 - 8.3 g/dL   Albumin 2.2 (*) 3.5 - 5.2 g/dL   AST 31  0 - 37 U/L   ALT 17  0 - 53 U/L   Alkaline Phosphatase 190 (*) 39 - 117 U/L   Total Bilirubin 1.1  0.3 - 1.2 mg/dL   GFR calc non Af Amer >90  >90 mL/min   GFR calc Af Amer >90  >90 mL/min    Signed: Marrian SalvageJacquelyn S Gill, MD 07/14/2013, 1:57 PM   Services Ordered on Discharge: None Equipment Ordered on Discharge: None

## 2013-07-12 NOTE — Progress Notes (Addendum)
Subjective:   Day of hospitalization: 1  VSS.  No overnight events.  Pt is feeling better this AM but pain is still 5/10, diffuse and constant without radiation.   Nausea but no vomiting.  No CP.  Some SOB.  He has not had a BM since Monday which is unusual for him and denies passing gas.  He reports dilaudid helps with the pain, he received 5 doses of dilaudid and 4 doses of norco.  Last dose 1038.  Also with h/o GERD. [facility] which he reports he never usually does and the pain began at that time.  States this does not feel exactly like the previous flare of pancreatitis.  No urinary symptoms.  He is +2.1L since admission.     Objective:   Vital signs in last 24 hours: Filed Vitals:   07/11/13 1705 07/11/13 1708 07/11/13 2114 07/12/13 0459  BP: 182/87 168/81 170/79 155/82  Pulse: 100 97 71 95  Temp:   98.5 F (36.9 C) 99.5 F (37.5 C)  TempSrc:   Oral Oral  Resp:   18 18  Height:   6' (1.829 m)   Weight:   240 lb (108.863 kg)   SpO2:   99% 95%    Weight: Filed Weights   07/11/13 0823 07/11/13 2114  Weight: 240 lb (108.863 kg) 240 lb (108.863 kg)    I/Os:  Intake/Output Summary (Last 24 hours) at 07/12/13 0941 Last data filed at 07/12/13 0600  Gross per 24 hour  Intake   2500 ml  Output    400 ml  Net   2100 ml    Physical Exam: Constitutional: Vital signs reviewed.  Patient is sitting lying in bed in mild distress but cooperative with exam.   HEENT: [place]/AT; PERRL, EOMI, conjunctivae normal, mild scleral icterus  Cardiovascular: RRR, no MRG Pulmonary/Chest: normal respiratory effort, no accessory muscle use, CTAB, no wheezes, rales, or rhonchi Abdominal: Soft. decreased BS, diffuse tenderness more localized in the epigastrium and LLQ, no guarding or rebound  Neurological: A&O x3, CN II-XII grossly intact; non-focal exam Extremities: 2+DP b/l, no C/C/E  Skin: Warm, dry and intact. No rash  Lab Results:  BMP:  Recent Labs Lab  07/11/13 0835 07/11/13 1410 07/12/13 0440  NA 135*  --  136*  K 4.4  --  3.4*  CL 97  --  97  CO2 23  --  25  GLUCOSE 141*  --  103*  BUN 10  --  8  CREATININE 0.83 0.86 0.89  CALCIUM 9.2  --  8.9   Anion Gap:  15-->14  CBC:  Recent Labs Lab 07/11/13 0835 07/11/13 1410 07/12/13 0440  WBC 9.3 8.5 18.1*  NEUTROABS 7.3  --   --   HGB 15.0 15.0 14.5  HCT 43.2 42.6 43.3  MCV 89.1 91.0 93.5  PLT 144* 127* 124*    Coagulation: No results found for this basename: LABPROT, INR,  in the last 168 hours  CBG:           No results found for this basename: GLUCAP,  in the last 168 hours         HA1C:       Recent Labs Lab 07/11/13 1410  HGBA1C 6.2*    Lipid Panel:  Recent Labs Lab 07/11/13 1410  CHOL 164  HDL 25*  LDLCALC 92  TRIG 161235*  CHOLHDL 6.6    LFTs:  Recent Labs Lab 07/11/13 0835  AST 152*  ALT 53  ALKPHOS 179*  BILITOT 1.4*  PROT 7.5  ALBUMIN 3.5    Pancreatic Enzymes:  Recent Labs Lab 07/11/13 0835  LIPASE 536*    Lactic Acid/Procalcitonin:  Recent Labs Lab 07/11/13 0852  LATICACIDVEN 2.11    Ammonia: No results found for this basename: AMMONIA,  in the last 168 hours  Cardiac Enzymes: No results found for this basename: CKTOTAL, CKMB, CKMBINDEX, TROPONINI,  in the last 168 hours  EKG: EKG Interpretation  Date/Time:  Wednesday July 11 2013 08:40:03 EDT Ventricular Rate:  66 PR Interval:  148 QRS Duration: 88 QT Interval:  486 QTC Calculation: 509 R Axis:   11 Text Interpretation:  Sinus rhythm Prolonged QT interval No significant change since last tracing Confirmed by POLLINA  MD, CHRISTOPHER [phone]([id number]) on 07/11/2013 9:07:02 AM   BNP: No results found for this basename: PROBNP,  in the last 168 hours  D-Dimer: No results found for this basename: DDIMER,  in the last 168 hours  Urinalysis:  Recent Labs Lab 07/11/13 0938  COLORURINE AMBER*  LABSPEC 1.021  PHURINE 6.0  GLUCOSEU NEGATIVE  HGBUR TRACE*   BILIRUBINUR NEGATIVE  KETONESUR NEGATIVE  PROTEINUR NEGATIVE  UROBILINOGEN 1.0  NITRITE NEGATIVE  LEUKOCYTESUR NEGATIVE    Micro Results: No results found for this or any previous visit (from the past 240 hour(s)).  Blood Culture: No results found for this basename: sdes,  specrequest,  cult,  reptstatus    Studies/Results: Dg Abd Acute W/chest  07/11/2013   CLINICAL DATA:  Abdominal pain.  EXAM: ACUTE ABDOMEN SERIES (ABDOMEN 2 VIEW & CHEST 1 VIEW)  COMPARISON:  None.  FINDINGS: Soft tissues unremarkable. Calcifications and pelvis consistent with phleboliths. Gas pattern nonspecific. No bowel distention or free air.  Chest x-ray is unremarkable.  IMPRESSION: Negative abdominal radiographs.  No acute cardiopulmonary disease.   Electronically Signed   By: Maisie Fushomas  Register   On: 07/11/2013 09:08    Medications:  Scheduled Meds: . chlorthalidone  25 mg Oral Daily  . enoxaparin (LOVENOX) injection  40 mg Subcutaneous Q24H  . folic acid  1 mg Oral Daily  . multivitamin with minerals  1 tablet Oral Daily  . potassium chloride  10 mEq Intravenous Q1 Hr x 4  . thiamine  100 mg Oral Daily   Or  . thiamine  100 mg Intravenous Daily   Continuous Infusions: . sodium chloride 150 mL/hr at 07/12/13 0515   PRN Meds: HYDROmorphone (DILAUDID) injection, LORazepam, LORazepam, ondansetron (ZOFRAN) IV, ondansetron  Antibiotics: Antibiotics Given (last 72 hours)   None      Day of Hospitalization: 1  Consults:    Assessment/Plan:   Active Problems:   Acute alcoholic pancreatitis   Alcohol abuse   Hypertension   Ulnar nerve compression  Acute recurrent alcoholic pancreatitis Pt feeling better this AM with pain 5/10.  Had prior episodes 2013.  Lipase 536. Consumes daily beer and hard liquor.  Hemodynamically stable, no significant electrolyte abnormalities, nl calcium, mildy hyponatremic. Will manage volume status with IV fluids and analgesics. Pt received 2L NS in ED.  Also with  leukocytosis likely d/t inflammation; pt meets SIR criteria.  Normal BUN.  VSS without tachycardia, afebrile.  Alcoholic gastritis may also be contributing to pain as pt reports increased belching.  -cont IV fluids 150 cc/h, monitor I&Os  -cont NPO except sips with meds  -IV Dilaudid 1 mg q4h prn -d/c hydrocodone 5-10 mg q4h prn  -protonix 40mg  IV qd  Hypertension  Pt reports having "borderline" HTN but does admit to taking antihypertensives in the past and has not refilled for some time and cannot recall names.  Began chlorthalidone yesterday. Pain may also be contributing.  May need to consider adding another agent pending resolution of pancreatitis.  HA1c: 6.2. LP with elevated trig: 235 and low HDL: 25.  LDL: 92.  -chlorthalidone 25 mg qd   Alcohol Abuse Pt does not think that he has a problem with drinking alcohol, no motivation to stop at this point. Counseled for cessation.  Also with elevated AST.  Placed on CIWA but has required no ativan.   -continue CIWA  Constipation Pt with no BM since Monday and is not passing gas. Ab XR neg.   -water tap enema  F/E/N Fluids- NS 176ml/h Electrolytes- Hypokalemic, hyponatremic; replete as needed  Nutrition- NPO; advance diet as tolerated  VTE PPx  lovenox 40mg  SQ qd   Disposition Disposition is deferred, awaiting improvement of current medical problems.  Anticipated discharge in approximately 1-2 day(s).  LOS: 1 day   Marrian Salvage, MD PGY-1, Internal Medicine Teaching Service [phone] (7AM-5PM Mon-Fri) 07/12/2013, 9:41 AM

## 2013-07-13 ENCOUNTER — Inpatient Hospital Stay (HOSPITAL_COMMUNITY): Payer: Non-veteran care

## 2013-07-13 DIAGNOSIS — K5900 Constipation, unspecified: Secondary | ICD-10-CM

## 2013-07-13 DIAGNOSIS — F1010 Alcohol abuse, uncomplicated: Secondary | ICD-10-CM

## 2013-07-13 LAB — COMPREHENSIVE METABOLIC PANEL
ALBUMIN: 2.3 g/dL — AB (ref 3.5–5.2)
ALK PHOS: 171 U/L — AB (ref 39–117)
ALT: 26 U/L (ref 0–53)
AST: 50 U/L — ABNORMAL HIGH (ref 0–37)
BUN: 11 mg/dL (ref 6–23)
CHLORIDE: 96 meq/L (ref 96–112)
CO2: 25 mEq/L (ref 19–32)
Calcium: 8.5 mg/dL (ref 8.4–10.5)
Creatinine, Ser: 0.91 mg/dL (ref 0.50–1.35)
GFR calc Af Amer: >90 mL/min (ref >90)
GFR calc non Af Amer: >90 mL/min (ref >90)
GLUCOSE: 107 mg/dL — AB (ref 70–99)
Potassium: 3.3 mEq/L — ABNORMAL LOW (ref 3.7–5.3)
Sodium: 135 mEq/L — ABNORMAL LOW (ref 137–147)
Total Bilirubin: 1.9 mg/dL — ABNORMAL HIGH (ref 0.3–1.2)
Total Protein: 6 g/dL (ref 6.0–8.3)

## 2013-07-13 LAB — CBC WITH DIFFERENTIAL/PLATELET
BASOS ABS: 0 10*3/uL (ref 0.0–0.1)
BASOS PCT: 0 % (ref 0–1)
EOS ABS: 0 10*3/uL (ref 0.0–0.7)
Eosinophils Relative: 0 % (ref 0–5)
HCT: 39.6 % (ref 39.0–52.0)
Hemoglobin: 13.5 g/dL (ref 13.0–17.0)
LYMPHS PCT: 4 % — AB (ref 12–46)
Lymphs Abs: 0.9 10*3/uL (ref 0.7–4.0)
MCH: 31.6 pg (ref 26.0–34.0)
MCHC: 34.1 g/dL (ref 30.0–36.0)
MCV: 92.7 fL (ref 78.0–100.0)
MONO ABS: 1.5 10*3/uL — AB (ref 0.1–1.0)
Monocytes Relative: 8 % (ref 3–12)
Neutro Abs: 17.1 10*3/uL — ABNORMAL HIGH (ref 1.7–7.7)
Neutrophils Relative %: 88 % — ABNORMAL HIGH (ref 43–77)
PLATELETS: 117 10*3/uL — AB (ref 150–400)
RBC: 4.27 MIL/uL (ref 4.22–5.81)
RDW: 14.3 % (ref 11.5–15.5)
WBC: 19.4 10*3/uL — AB (ref 4.0–10.5)

## 2013-07-13 LAB — PHOSPHORUS: Phosphorus: 2.4 mg/dL (ref 2.3–4.6)

## 2013-07-13 LAB — MAGNESIUM: Magnesium: 1.8 mg/dL (ref 1.5–2.5)

## 2013-07-13 LAB — GLUCOSE, CAPILLARY
GLUCOSE-CAPILLARY: 113 mg/dL — AB (ref 70–99)
Glucose-Capillary: 102 mg/dL — ABNORMAL HIGH (ref 70–99)

## 2013-07-13 MED ORDER — HYDROCODONE-ACETAMINOPHEN 5-325 MG PO TABS
1.0000 | ORAL_TABLET | Freq: Four times a day (QID) | ORAL | Status: DC | PRN
  Administered 2013-07-13 – 2013-07-14 (×4): 2 via ORAL
  Filled 2013-07-13 (×4): qty 2

## 2013-07-13 MED ORDER — PANTOPRAZOLE SODIUM 40 MG PO TBEC
40.0000 mg | DELAYED_RELEASE_TABLET | Freq: Every day | ORAL | Status: DC
  Administered 2013-07-14: 40 mg via ORAL
  Filled 2013-07-13: qty 1

## 2013-07-13 MED ORDER — POTASSIUM CHLORIDE 10 MEQ/100ML IV SOLN
10.0000 meq | INTRAVENOUS | Status: AC
  Administered 2013-07-13 (×4): 10 meq via INTRAVENOUS
  Filled 2013-07-13 (×4): qty 100

## 2013-07-13 MED ORDER — PROMETHAZINE HCL 25 MG PO TABS: 25.0000 mg | ORAL_TABLET | Freq: Four times a day (QID) | ORAL | Status: DC | PRN

## 2013-07-13 NOTE — Progress Notes (Signed)
  Date: 07/13/2013  Patient name: Brandon Camacho  Medical record number: [id number]  Date of birth: August 11, 1955   This patient has been seen and the plan of care was discussed with the house staff. Please see their note for complete details. I concur with their findings with the following additions/corrections:  Pain unchanged, improved with dilaudid.  Abdominal exam is more distended than yesterday and patient without BM despite enema.  He reports no vomiting.  Will repeat Abdominal Xray now.   Inez CatalinaEmily B Mullen, MD 07/13/2013, 2:02 PM

## 2013-07-13 NOTE — Progress Notes (Addendum)
Subjective:   Day of hospitalization: 2  VSS, afebrile.  He is requiring dilaudid every 4 hrs (received 4 doses yesterday, 2 doses overnight).  No phenergan.  He is +3.7L since admission (+1.6L yesterday).  Still reporting pain that has decreased from yesterday.  No BM despite receiving enema.     Objective:   Vital signs in last 24 hours: Filed Vitals:   07/12/13 1750 07/12/13 2116 07/13/13 0055 07/13/13 0544  BP: 154/81 143/110 129/71 152/74  Pulse: 89 110 105 99  Temp: 98.4 F (36.9 C) 99.4 F (37.4 C)  99.9 F (37.7 C)  TempSrc: Oral Oral  Oral  Resp: 18 18  18   Height:      Weight:      SpO2: 96% 95%  94%    Weight: Filed Weights   07/11/13 0823 07/11/13 2114  Weight: 240 lb (108.863 kg) 240 lb (108.863 kg)    I/Os:  Intake/Output Summary (Last 24 hours) at 07/13/13 0802 Last data filed at 07/12/13 2300  Gross per 24 hour  Intake   2550 ml  Output    925 ml  Net   1625 ml    Physical Exam: Constitutional: Vital signs reviewed.  Patient is sitting lying in bed in mild distress but cooperative with exam.   HEENT: [place]/AT; PERRL, EOMI, conjunctivae normal, mild scleral icterus  Cardiovascular: RRR, no MRG Pulmonary/Chest: normal respiratory effort, no accessory muscle use, CTAB, no wheezes, rales, or rhonchi Abdominal: Soft. mildly distended, decreased BS, diffuse tenderness more localized in the epigastrium and LLQ, no guarding or rebound  Neurological: A&O x3, CN II-XII grossly intact; non-focal exam Extremities: 2+DP b/l, no C/C/E  Skin: Warm, dry and intact. No rash  Lab Results:  BMP:  Recent Labs Lab 07/12/13 0440 07/13/13 0308  NA 136* 135*  K 3.4* 3.3*  CL 97 96  CO2 25 25  GLUCOSE 103* 107*  BUN 8 11  CREATININE 0.89 0.91  CALCIUM 8.9 8.5  MG  --  1.8  PHOS  --  2.4   Anion Gap:  15-->14  CBC:  Recent Labs Lab 07/11/13 0835  07/12/13 0440 07/13/13 0308  WBC 9.3  < > 18.1* 19.4*  NEUTROABS 7.3  --   --  17.1*  HGB 15.0   < > 14.5 13.5  HCT 43.2  < > 43.3 39.6  MCV 89.1  < > 93.5 92.7  PLT 144*  < > 124* 117*  < > = values in this interval not displayed.  Coagulation: No results found for this basename: LABPROT, INR,  in the last 168 hours  CBG:           No results found for this basename: GLUCAP,  in the last 168 hours         HA1C:       Recent Labs Lab 07/11/13 1410  HGBA1C 6.2*    Lipid Panel:  Recent Labs Lab 07/11/13 1410  CHOL 164  HDL 25*  LDLCALC 92  TRIG 409235*  CHOLHDL 6.6    LFTs:  Recent Labs Lab 07/11/13 0835 07/13/13 0308  AST 152* 50*  ALT 53 26  ALKPHOS 179* 171*  BILITOT 1.4* 1.9*  PROT 7.5 6.0  ALBUMIN 3.5 2.3*    Pancreatic Enzymes:  Recent Labs Lab 07/11/13 0835  LIPASE 536*    Lactic Acid/Procalcitonin:  Recent Labs Lab 07/11/13 0852  LATICACIDVEN 2.11    Ammonia: No results found for this basename: AMMONIA,  in the  last 168 hours  Cardiac Enzymes: No results found for this basename: CKTOTAL, CKMB, CKMBINDEX, TROPONINI,  in the last 168 hours  EKG: EKG Interpretation  Date/Time:  Wednesday July 11 2013 08:40:03 EDT Ventricular Rate:  66 PR Interval:  148 QRS Duration: 88 QT Interval:  486 QTC Calculation: 509 R Axis:   11 Text Interpretation:  Sinus rhythm Prolonged QT interval No significant change since last tracing Confirmed by POLLINA  MD, CHRISTOPHER [phone]) on 07/11/2013 9:07:02 AM   BNP: No results found for this basename: PROBNP,  in the last 168 hours  D-Dimer: No results found for this basename: DDIMER,  in the last 168 hours  Urinalysis:  Recent Labs Lab 07/11/13 0938  COLORURINE AMBER*  LABSPEC 1.021  PHURINE 6.0  GLUCOSEU NEGATIVE  HGBUR TRACE*  BILIRUBINUR NEGATIVE  KETONESUR NEGATIVE  PROTEINUR NEGATIVE  UROBILINOGEN 1.0  NITRITE NEGATIVE  LEUKOCYTESUR NEGATIVE    Micro Results: No results found for this or any previous visit (from the past 240 hour(s)).  Blood Culture: No results found for  this basename: sdes,  specrequest,  cult,  reptstatus    Studies/Results: Dg Abd Acute W/chest  07/11/2013   CLINICAL DATA:  Abdominal pain.  EXAM: ACUTE ABDOMEN SERIES (ABDOMEN 2 VIEW & CHEST 1 VIEW)  COMPARISON:  None.  FINDINGS: Soft tissues unremarkable. Calcifications and pelvis consistent with phleboliths. Gas pattern nonspecific. No bowel distention or free air.  Chest x-ray is unremarkable.  IMPRESSION: Negative abdominal radiographs.  No acute cardiopulmonary disease.   Electronically Signed   By: Maisie Fus  Register   On: 07/11/2013 09:08    Medications:  Scheduled Meds: . chlorthalidone  25 mg Oral Daily  . enoxaparin (LOVENOX) injection  40 mg Subcutaneous Q24H  . folic acid  1 mg Oral Daily  . multivitamin with minerals  1 tablet Oral Daily  . pantoprazole (PROTONIX) IV  40 mg Intravenous Daily  . potassium chloride  10 mEq Intravenous Q1 Hr x 4  . thiamine  100 mg Oral Daily   Or  . thiamine  100 mg Intravenous Daily   Continuous Infusions: . sodium chloride 150 mL/hr at 07/13/13 1914   PRN Meds: HYDROmorphone (DILAUDID) injection, LORazepam, LORazepam, promethazine  Antibiotics: Antibiotics Given (last 72 hours)   None      Day of Hospitalization: 2  Consults:    Assessment/Plan:   Active Problems:   Acute alcoholic pancreatitis   Alcohol abuse   Hypertension   Ulnar nerve compression  Acute recurrent alcoholic pancreatitis Pt feeling better this AM with decreased pain.  No N/V, but abdomen slightly more distended.  Had prior episodes 2013.  Lipase 536. Consumes daily beer and hard liquor. Hemodynamically stable, no significant electrolyte abnormalities, nl calcium, mildy hyponatremic. Will manage volume status with IV fluids and analgesics. Pt received 2L NS in ED.  Also with leukocytosis d/t inflammation; pt meets SIRS criteria.  Normal BUN.  VSS without tachycardia, afebrile.  Alcoholic gastritis may also be contributing to pain as pt reports increased  belching.  -cont IV fluids 150 cc/h, monitor I&Os  -reassess this AM and will possibly try clears  -IV Dilaudid 1 mg q4h prn -protonix 40mg  IV qd -repeat Abd XR   Hypertension Pt reports having "borderline" HTN but does admit to taking antihypertensives in the past and has not refilled for some time and cannot recall names.  Began chlorthalidone yesterday. Pain may also be contributing.  May need to consider adding another agent pending resolution of pancreatitis.  HA1c: 6.2. LP with elevated trig: 235 and low HDL: 25.  LDL: 92.  -chlorthalidone 25 mg qd   Alcohol Abuse Pt does not think that he has a problem with drinking alcohol, no motivation to stop at this point. Counseled for cessation.  Also with elevated AST.  Placed on CIWA but has required no ativan.   -continue CIWA  Constipation Pt with no BM since Monday and is not passing gas. Ab XR neg.   -fleet enema  F/E/N Fluids- NS 19150ml/h Electrolytes- Hypokalemic, hyponatremic; repleted Nutrition- NPO; advance diet as tolerated  VTE PPx  lovenox 40mg  SQ qd   Disposition Disposition is deferred, awaiting improvement of current medical problems.  Anticipated discharge in approximately 1-2 day(s).  LOS: 2 days   Marrian SalvageJacquelyn S Gill, MD PGY-1, Internal Medicine Teaching Service [phone] (7AM-5PM Mon-Fri) 07/13/2013, 8:02 AM

## 2013-07-14 LAB — CBC
HEMATOCRIT: 38.1 % — AB (ref 39.0–52.0)
HEMOGLOBIN: 12.8 g/dL — AB (ref 13.0–17.0)
MCH: 31.4 pg (ref 26.0–34.0)
MCHC: 33.6 g/dL (ref 30.0–36.0)
MCV: 93.6 fL (ref 78.0–100.0)
Platelets: 125 10*3/uL — ABNORMAL LOW (ref 150–400)
RBC: 4.07 MIL/uL — ABNORMAL LOW (ref 4.22–5.81)
RDW: 13.9 % (ref 11.5–15.5)
WBC: 13.8 10*3/uL — ABNORMAL HIGH (ref 4.0–10.5)

## 2013-07-14 LAB — COMPREHENSIVE METABOLIC PANEL
ALK PHOS: 190 U/L — AB (ref 39–117)
ALT: 17 U/L (ref 0–53)
AST: 31 U/L (ref 0–37)
Albumin: 2.2 g/dL — ABNORMAL LOW (ref 3.5–5.2)
BUN: 10 mg/dL (ref 6–23)
CO2: 28 mEq/L (ref 19–32)
Calcium: 8.5 mg/dL (ref 8.4–10.5)
Chloride: 96 mEq/L (ref 96–112)
Creatinine, Ser: 0.77 mg/dL (ref 0.50–1.35)
GFR calc non Af Amer: >90 mL/min (ref >90)
GLUCOSE: 89 mg/dL (ref 70–99)
Potassium: 3.2 mEq/L — ABNORMAL LOW (ref 3.7–5.3)
Sodium: 136 mEq/L — ABNORMAL LOW (ref 137–147)
Total Bilirubin: 1.1 mg/dL (ref 0.3–1.2)
Total Protein: 6.1 g/dL (ref 6.0–8.3)

## 2013-07-14 MED ORDER — HYDROCODONE-ACETAMINOPHEN 5-325 MG PO TABS: 1.0000 | ORAL_TABLET | Freq: Four times a day (QID) | ORAL | Status: AC | PRN

## 2013-07-14 MED ORDER — CHLORTHALIDONE 25 MG PO TABS: 25.0000 mg | ORAL_TABLET | Freq: Every day | ORAL | Status: AC

## 2013-07-14 MED ORDER — PANTOPRAZOLE SODIUM 40 MG PO TBEC: 40.0000 mg | DELAYED_RELEASE_TABLET | Freq: Every day | ORAL | Status: AC

## 2013-07-14 MED ORDER — POLYETHYLENE GLYCOL 3350 17 G PO PACK
17.0000 g | PACK | Freq: Every day | ORAL | Status: DC
  Filled 2013-07-14: qty 1

## 2013-07-14 MED ORDER — POTASSIUM CHLORIDE CRYS ER 20 MEQ PO TBCR: 40.0000 meq | EXTENDED_RELEASE_TABLET | Freq: Once | ORAL | Status: DC

## 2013-07-14 MED ORDER — ADULT MULTIVITAMIN W/MINERALS CH: 1.0000 | ORAL_TABLET | Freq: Every day | ORAL | Status: AC

## 2013-07-14 NOTE — Discharge Instructions (Signed)
Please keep your follow-up appointments; this is very important for your continued recovery.    Please continue to take all of your medications as prescribed.  Do not miss any doses without contacting your primary physician.  If you have questions, please contact your physician or contact the Internal Medicine Teaching Service at [phone].  Please bring your medicications with you to your appointments; medications may be eye drops, herbals, vitamins, or pills.    If you believe you are suffering from a life-threatening emergency, go to the nearest Emergency Department.      [employer] Guide  1) Find a Scientist, water quality Although you won't have to find out who is covered by [employer] plan, it is a good idea to ask around and get recommendations. You will then need to call the office and see if the doctor you have chosen will accept you as a new patient and what types of options they offer for patients who are self-pay. Some doctors offer discounts or will set up payment plans for their patients who do not have insurance, but you will need to ask so you aren't surprised when you get to your appointment.  2) Contact Your Local Health Department Not all health departments have doctors that can see patients for sick visits, but many do, so it is worth a call to see if yours does. If you don't know where your local health department is, you can check in your phone book. The CDC also has a tool to help you locate your state's health department, and many state websites also have listings of all of their local health departments.  3) Find a Walk-in Clinic If your illness is not likely to be very severe or complicated, you may want to try a walk in clinic. These are popping up all over the country in pharmacies, drugstores, and shopping centers. They're usually staffed by nurse practitioners or physician assistants that have been trained to treat common illnesses and complaints.  They're usually fairly quick and inexpensive. However, if you have serious medical issues or chronic medical problems, these are probably not your best option.  No Primary Care Doctor: - Call Health Connect at  [phone] - they can help you locate a primary care doctor that  accepts your insurance, provides certain services, etc. - Physician Referral Service- [phone]  Chronic Pain Problems: Organization         Address  Phone   Notes  [facility]  [phone] Patients need to be referred by their primary care doctor.   Medication Assistance: Organization         Address  Phone   Notes  [facility] Medication [facility] [address] [place], [place] [address] [phone] --Must be a resident of [facility] -- Must have NO insurance coverage whatsoever (no Medicaid/ Medicare, etc.) -- The pt. MUST have a primary care doctor that directs their care regularly and follows them in the community   MedAssist  [phone]   [employer]  [phone]    Agencies that provide inexpensive medical care: Organization         Address  Phone   Notes  [facility]  [phone]   [facility]    [phone]   [facility] [address] [place], [place] [address] [phone]   [facility] [address] [place], [place] [phone])  [phone]   Planned Parenthood    [phone]   [facility]    [phone]   [facility]  [address], [place] Phone:  [phone], Fax:  [phone] Hours of Operation:  9 am - 6 pm, M-F.  Also accepts Medicaid/Medicare and self-pay.  [facility]  [address], [place] Phone: [phone], Fax: [phone]. Hours of Operation:  8:30 am - 5:30 pm, M-F.  Also accepts Medicaid and self-pay.  [facility] [place] [address], [place]  Phone: [phone]) [phone]   [facility] [address], [place], [place] [phone], Ext. [phone] Mondays & Thursdays: 7-9 AM.  First 15 patients are seen on a first come, first serve basis.    Medicaid-accepting [facility] Providers:  Organization         Address  Phone   Notes  [facility] [address], [place] [phone] Also accepts self-pay patients.  [facility] [address] [place], [place]  [phone](336) [phone]   [facility] [address], [place] [phone]   [facility] [address] [place] [address], [place] [phone]) [phone]   Renaye RakersVeita Bland [address], [place]   [phone] Only accepts [place] Access [place]icaid patients after they have their name applied to their card.   Self-Pay (no insurance) in [facility] [place]:  Organization         Address  Phone   Notes  Sickle Cell Patients, [facility] [address], [place] [phone]   [facility] [address], [place] [phone]   [facility]  [address], [place] [phone]   [facility]/Dr. Osei-Bonsu  [address] [place] [address], [place] or [phone], [place] [phone] Phone number for both [place] and [place] locations is the same.  [facility] [address], [place] [phone]   [facility] [address] [place] [address], [place] or [address] [phone]   [facility] [address], [place] [phone], phone; [phone], fax Sees patients 1st and 3rd Saturday of every month.  Must not qualify for public or private insurance (i.e. Medicaid, Medicare, [place] Health Choice, Veterans' Benefits)  Household income should be no more than 200% of the poverty level The clinic cannot  treat you if you are pregnant or think you are pregnant  Sexually transmitted diseases are not treated at the clinic.    Dental Care: Organization         Address  Phone  Notes  [facility] [address], [place] [phone] Accepts children up to age 57 who are enrolled in [place]icaid or [place] Health Choice; pregnant women with a Medicaid card; and children who have applied for Medicaid or [place] Health Choice, but were declined, whose parents can pay a reduced fee at time of service.  [facility] [place]  [address], [place] [phone] Accepts children up to age 57 who are enrolled in [place]icaid or [place] Health Choice; pregnant women with a Medicaid card; and children who have applied for Medicaid or [place] Health Choice,  but were declined, whose parents can pay a reduced fee at time of service.  [facility]  [address], [place] [phone] Patients are seen by appointment only. Walk-ins are not accepted. [facility] will see patients 57 years of age and older. Monday - Tuesday (8am-5pm) Most Wednesdays (8:30-5pm) $30 per visit, cash only  [facility]  [address], [facility] [phone] Patients are seen by appointment only. Walk-ins are not accepted. [facility] will see patients 57 years of age and older. One Wednesday Evening (Monthly: Volunteer Based).  $30 per visit, cash only  [employer] [facility] [facility]  [phone] for adults; Children under age 604, call Graduate Pediatric Dentistry at [phone]. Children aged 414-14, please call [phone] to request a pediatric application.  Dental services are provided in all areas of dental care including fillings, crowns and bridges, complete and partial dentures, implants, gum treatment, root canals, and extractions. Preventive care is also provided.  Treatment is provided to both adults and children. Patients are selected via a lottery and there is often a waiting list.   [facility] [address], [place]  [phone]) [phone] [email or web address]   [facility] [address], [place], [place] [phone], Ext. [phone] Second and Fourth Thursday of each month, opens at 6:30 AM; Clinic ends at 9 AM.  Patients are seen on a first-come first-served basis, and a limited number are seen during each clinic.   [facility]  [address], [place], [place] [phone]   Eligibility Requirements You must have lived in [place], [place], or [place] counties for at least the last three months.   You cannot be eligible for state or federal sponsored National Cityhealthcare insurance, including CIGNAVeterans Administration, [place]icaid, or [employer]Medicare.   You generally cannot be eligible for healthcare insurance through your employer.    How to apply: Eligibility screenings are held every Tuesday and Wednesday afternoon from 1:00 pm until 4:00 pm. You do not need an appointment for the interview!  [facility] [address], [place], [place] [phone]   [facility]  [phone]   [facility]  [phone]   [facility]  [phone]    Behavioral Health Resources in the Community: Intensive Outpatient Programs Organization         Address  Phone  Notes  [facility] [address], [place], [place] [phone]   [facility] Outpatient [address], [place], [place] [phone]   [facility] [address], [place], [place]  [phone]   [facility] [address],  [place], [place] [phone] or [phone]   Substance Abuse Resources Organization         Address  Phone  Notes  [facility]  [phone]   [facility]   [phone]   [facility]  [phone]   [facility]  [phone]   Residential & Outpatient Substance Abuse Program  [phone]   Psychological Services Organization         Address  Phone  Notes  [facility]  [phone]   [facility]  [phone]   [facility] [address], [place] [phone] or [phone]    Mobile Crisis Teams Organization         Address  Phone  Notes  [facility], Mobile Crisis Care Unit  [phone]   [facility]  [address] [place], [place] [phone]   [facility] De[facility]sch [address] [place] [phone]    Self-Help/Support Groups Organization         Address  Phone             Notes  [facility] Assoc[facility] of [place] - variety of support groups  [phone] Call for more information  Narcotics Anonymous (NA), Caring [facility] [address], [place]  2 meetings at this location   Statisticianesidential Treatment Programs Organization         Address  Phone  Notes  [facility] [address],    [place]  [phone]   [facility]  [address], [place] [phone], [place], [place] [phone]   [facility] [address], [place] [phone] Admissions: 8am-3pm M-F  [facility] [address]    [place], [place] [phone]   [facility] [address], [place], [place] [phone]   [facility] [address]  [place], [place] [phone]   [facility] - Intensive Outpatient [address], [place], [place] [phone]   [facility] ([facility] [address]  [place], [place] [phone] or [phone]   [facility] (RTS) [address] [place], [place] [phone] Accepts Medicaid  [facility] [address]  [place] [phone] Substance  Abuse/Addiction Treatment   [facility] Behavioral Health Resources Organization         Address  Phone  Notes  [facility]  [phone]) [phone]   Angie FavaJulie Brannon, PhD [address], [address] [place], [place]   [phone] or [phone](336) [phone]   [facility]   [address] [place], [place] [phone]   [facility] [address], [place], [place] [phone]) [phone] Insurance/Medicaid/sponsorship through [facility]  [facility] [address]                                    [place], [place] [phone]) [phone] Therapy/tele-psych/case  [facility] [address].   [place], [place] [phone]    Dr. Lolly MustacheArfeen  [phone](336) [phone]   [facility] [place]  [employer] [facility] 1) [address], [place] 2) [address], [place] 3)  [address], [place] [phone]  [phone]) [phone]   [facility] Child Abuse Hotline [phone]) [phone] or [phone](336) [phone] (After Hours)

## 2013-07-14 NOTE — Progress Notes (Signed)
Subjective:   Day of hospitalization: 3  VSS, afebrile.  Tolerating diet without N/V.  Improved pain. +2.2L since admission.    Objective:   Vital signs in last 24 hours: Filed Vitals:   07/13/13 1700 07/13/13 2218 07/14/13 0549 07/14/13 0900  BP: 159/88 144/82 167/83 140/74  Pulse: 96 73 74 92  Temp: 98 F (36.7 C) 99.1 F (37.3 C) 98.1 F (36.7 C) 98.8 F (37.1 C)  TempSrc: Oral Oral Oral Oral  Resp: 22 20 19 17   Height:  6' (1.829 m)    Weight:  238 lb 5.1 oz (108.1 kg)    SpO2: 95% 97% 95% 99%    Weight: Filed Weights   07/11/13 0823 07/11/13 2114 07/13/13 2218  Weight: 240 lb (108.863 kg) 240 lb (108.863 kg) 238 lb 5.1 oz (108.1 kg)    I/Os:  Intake/Output Summary (Last 24 hours) at 07/14/13 1328 Last data filed at 07/14/13 0900  Gross per 24 hour  Intake    360 ml  Output   2300 ml  Net  -1940 ml    Physical Exam: Constitutional: Vital signs reviewed.  Patient is sitting lying in bed in mild distress but cooperative with exam.   HEENT: [place]/AT; PERRL, EOMI, conjunctivae normal, mild scleral icterus  Cardiovascular: RRR, no MRG Pulmonary/Chest: normal respiratory effort, no accessory muscle use, CTAB, no wheezes, rales, or rhonchi Abdominal: Soft. mildly distended, +BS, mild diffuse tenderness, no guarding or rebound  Neurological: A&O x3, CN II-XII grossly intact; non-focal exam Extremities: 2+DP b/l, no C/C/E  Skin: Warm, dry and intact. No rash  Lab Results:  BMP:  Recent Labs Lab 07/12/13 0440 07/13/13 0308 07/14/13 0442  NA 136* 135* 136*  K 3.4* 3.3* 3.2*  CL 97 96 96  CO2 25 25 28   GLUCOSE 103* 107* 89  BUN 8 11 10   CREATININE 0.89 0.91 0.77  CALCIUM 8.9 8.5 8.5  MG  --  1.8  --   PHOS  --  2.4  --    Anion Gap:  15-->12  CBC:  Recent Labs Lab 07/11/13 0835  07/13/13 0308 07/14/13 0442  WBC 9.3  < > 19.4* 13.8*  NEUTROABS 7.3  --  17.1*  --   HGB 15.0  < > 13.5 12.8*  HCT 43.2  < > 39.6 38.1*  MCV 89.1  < > 92.7 93.6   PLT 144*  < > 117* 125*  < > = values in this interval not displayed.  Coagulation: No results found for this basename: LABPROT, INR,  in the last 168 hours  CBG:            Recent Labs Lab 07/13/13 0827 07/13/13 1210  GLUCAP 102* 113*           HA1C:       Recent Labs Lab 07/11/13 1410  HGBA1C 6.2*    Lipid Panel:  Recent Labs Lab 07/11/13 1410  CHOL 164  HDL 25*  LDLCALC 92  TRIG 629235*  CHOLHDL 6.6    LFTs:  Recent Labs Lab 07/13/13 0308 07/14/13 0442  AST 50* 31  ALT 26 17  ALKPHOS 171* 190*  BILITOT 1.9* 1.1  PROT 6.0 6.1  ALBUMIN 2.3* 2.2*    Pancreatic Enzymes:  Recent Labs Lab 07/11/13 0835  LIPASE 536*    Lactic Acid/Procalcitonin:  Recent Labs Lab 07/11/13 0852  LATICACIDVEN 2.11    Ammonia: No results found for this basename: AMMONIA,  in the last 168 hours  Cardiac Enzymes: No results found for this basename: CKTOTAL, CKMB, CKMBINDEX, TROPONINI,  in the last 168 hours  EKG: EKG Interpretation  Date/Time:  Wednesday July 11 2013 08:40:03 EDT Ventricular Rate:  66 PR Interval:  148 QRS Duration: 88 QT Interval:  486 QTC Calculation: 509 R Axis:   11 Text Interpretation:  Sinus rhythm Prolonged QT interval No significant change since last tracing Confirmed by POLLINA  MD, CHRISTOPHER [phone]([id number]) on 07/11/2013 9:07:02 AM   BNP: No results found for this basename: PROBNP,  in the last 168 hours  D-Dimer: No results found for this basename: DDIMER,  in the last 168 hours  Urinalysis:  Recent Labs Lab 07/11/13 0938  COLORURINE AMBER*  LABSPEC 1.021  PHURINE 6.0  GLUCOSEU NEGATIVE  HGBUR TRACE*  BILIRUBINUR NEGATIVE  KETONESUR NEGATIVE  PROTEINUR NEGATIVE  UROBILINOGEN 1.0  NITRITE NEGATIVE  LEUKOCYTESUR NEGATIVE   Studies/Results: Dg Abd 1 View  07/13/2013   CLINICAL DATA:  Abdominal pain  EXAM: ABDOMEN - 1 VIEW  COMPARISON:  07/11/2013  FINDINGS: Normal bowel gas pattern.  No bowel dilatation or bowel wall  thickening.  LEFT pelvic phleboliths stable.  No urinary tract calcification.  Osseous structures unremarkable.  IMPRESSION: Normal exam.   Electronically Signed   By: Ulyses SouthwardMark  Boles M.D.   On: 07/13/2013 14:40    Medications:  Scheduled Meds: . chlorthalidone  25 mg Oral Daily  . enoxaparin (LOVENOX) injection  40 mg Subcutaneous Q24H  . folic acid  1 mg Oral Daily  . multivitamin with minerals  1 tablet Oral Daily  . pantoprazole  40 mg Oral Daily  . potassium chloride  40 mEq Oral Once  . thiamine  100 mg Oral Daily   Continuous Infusions: . sodium chloride 75 mL/hr at 07/13/13 2120   PRN Meds: HYDROcodone-acetaminophen, promethazine  Antibiotics: Antibiotics Given (last 72 hours)   None      Day of Hospitalization: 3  Consults:    Assessment/Plan:   Active Problems:   Acute alcoholic pancreatitis   Alcohol abuse   Hypertension   Ulnar nerve compression  Acute recurrent alcoholic pancreatitis Pt feeling much better this AM with decreased pain.  No N/V.  Tolerated a full liquid diet.  Had prior episode 2013.  Leukocytosis improving.  Abd XR normal.  Consumes daily beer and hard liquor.  -d/c IVF -continue protonix 40mg  qd -d/c today   Hypertension Pt reports having "borderline" HTN but does admit to taking antihypertensives in the past and has not refilled for some time and cannot recall names.  Pain may also be contributing.  May need to consider adding another agent pending resolution of pancreatitis.  HA1c: 6.2. LP with elevated trig: 235 and low HDL: 25.  LDL: 92.  -chlorthalidone 25 mg qd   Alcohol Abuse Pt does not think that he has a problem with drinking alcohol, no motivation to stop at this point. Counseled for cessation.  Also with elevated AST.  Placed on CIWA but has required no ativan.   -continue CIWA  Constipation Pt with no BM since Monday and is not passing gas. Ab XR neg.   -miralax   F/E/N Fluids- None Electrolytes- Hypokalemic,  repleted Nutrition- Regular diet   VTE PPx  lovenox 40mg  SQ qd   Disposition Anticipated discharge today.   LOS: 3 days   Marrian SalvageJacquelyn S Gill, MD PGY-1, Internal Medicine Teaching Service [phone] (7AM-5PM Mon-Fri) 07/14/2013, 1:28 PM

## 2013-07-17 NOTE — Discharge Summary (Signed)
I did not see Mr. Brandon Camacho on day of discharge, but assisted with the discharge planning.

## 2016-02-17 IMAGING — CR DG ABDOMEN 1V
2 series · 2 of 2 positions shown · non-contrast
Comparison: 07/11/2013

CLINICAL DATA: Abdominal pain

EXAM:
ABDOMEN - 1 VIEW

[t abdomen supine]
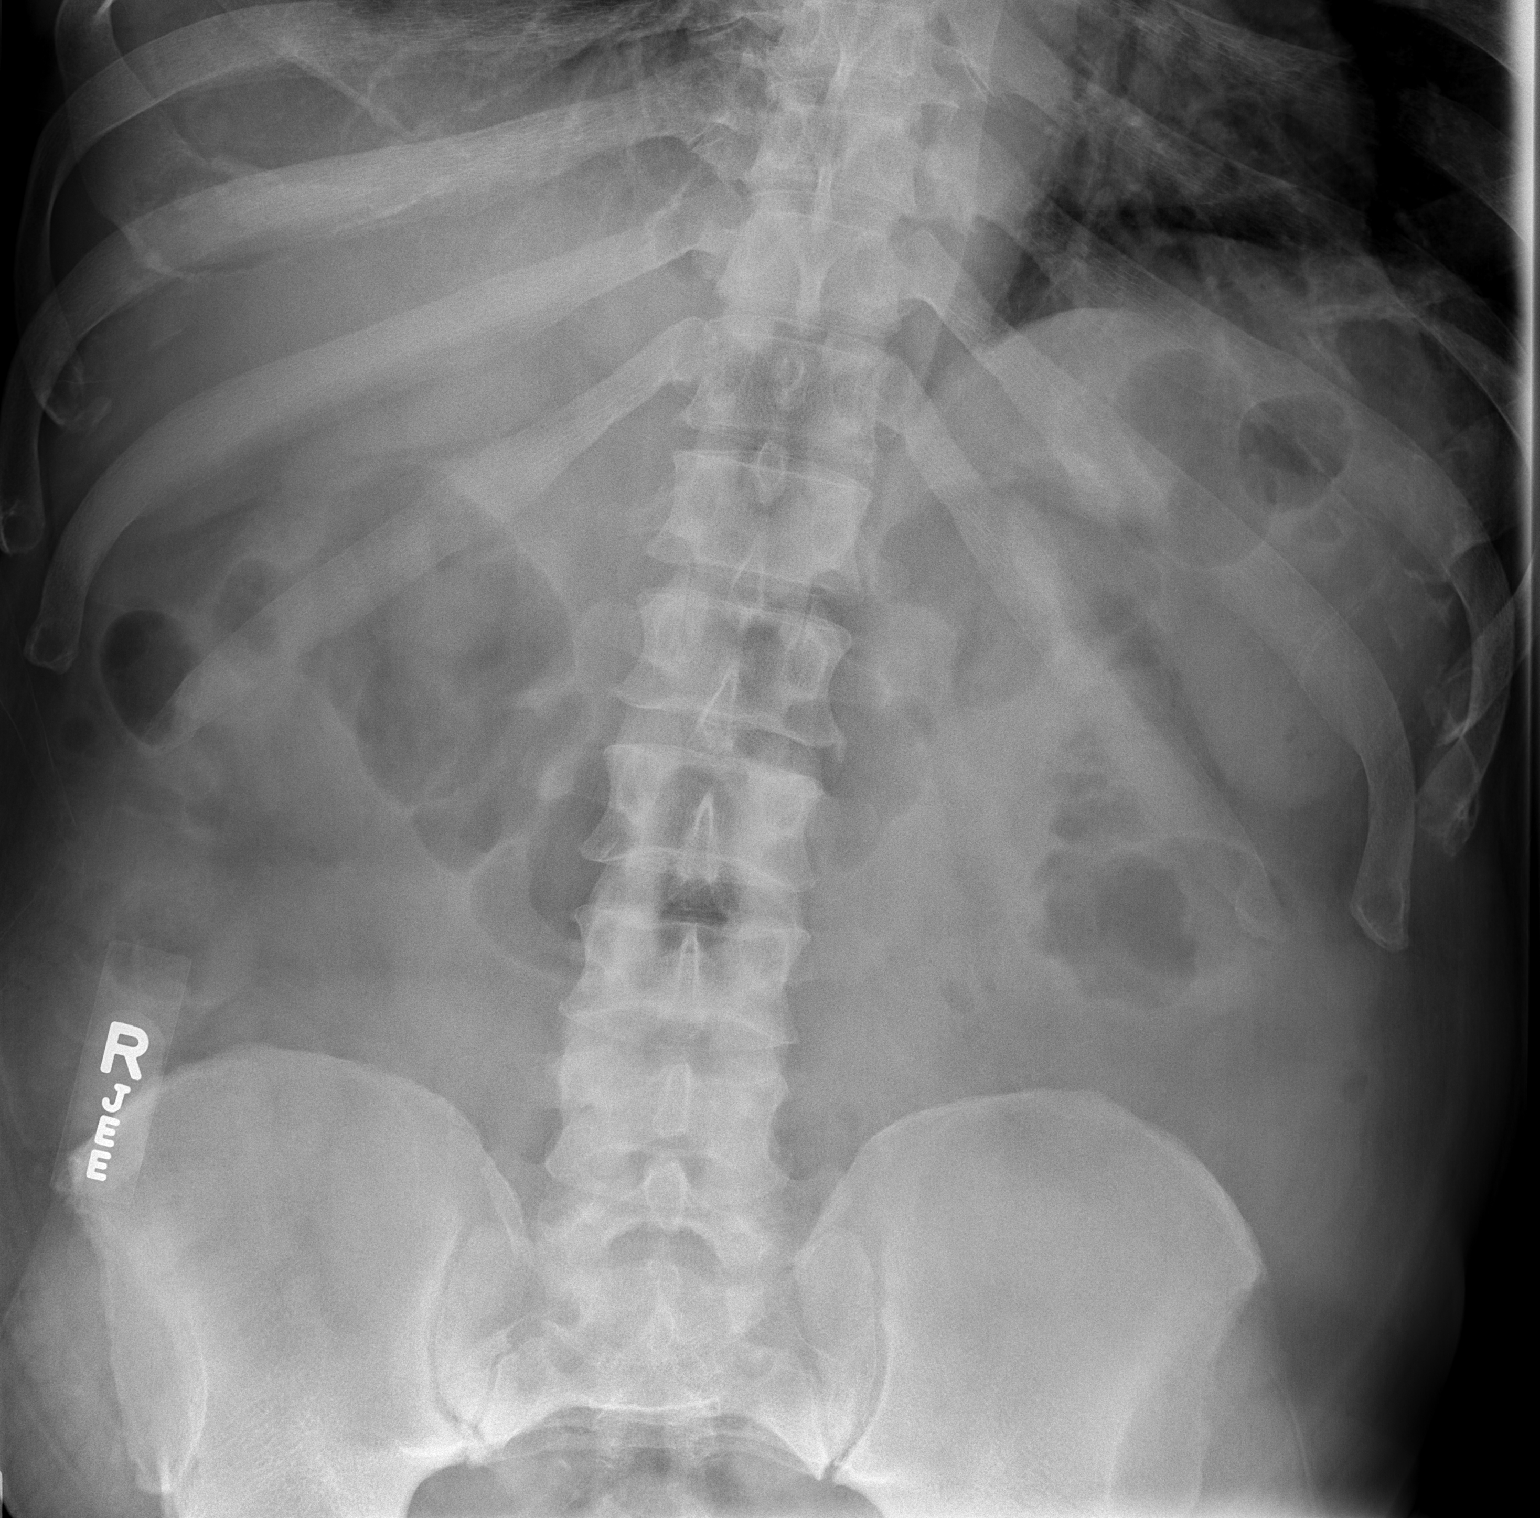

[t abdomen supine *]
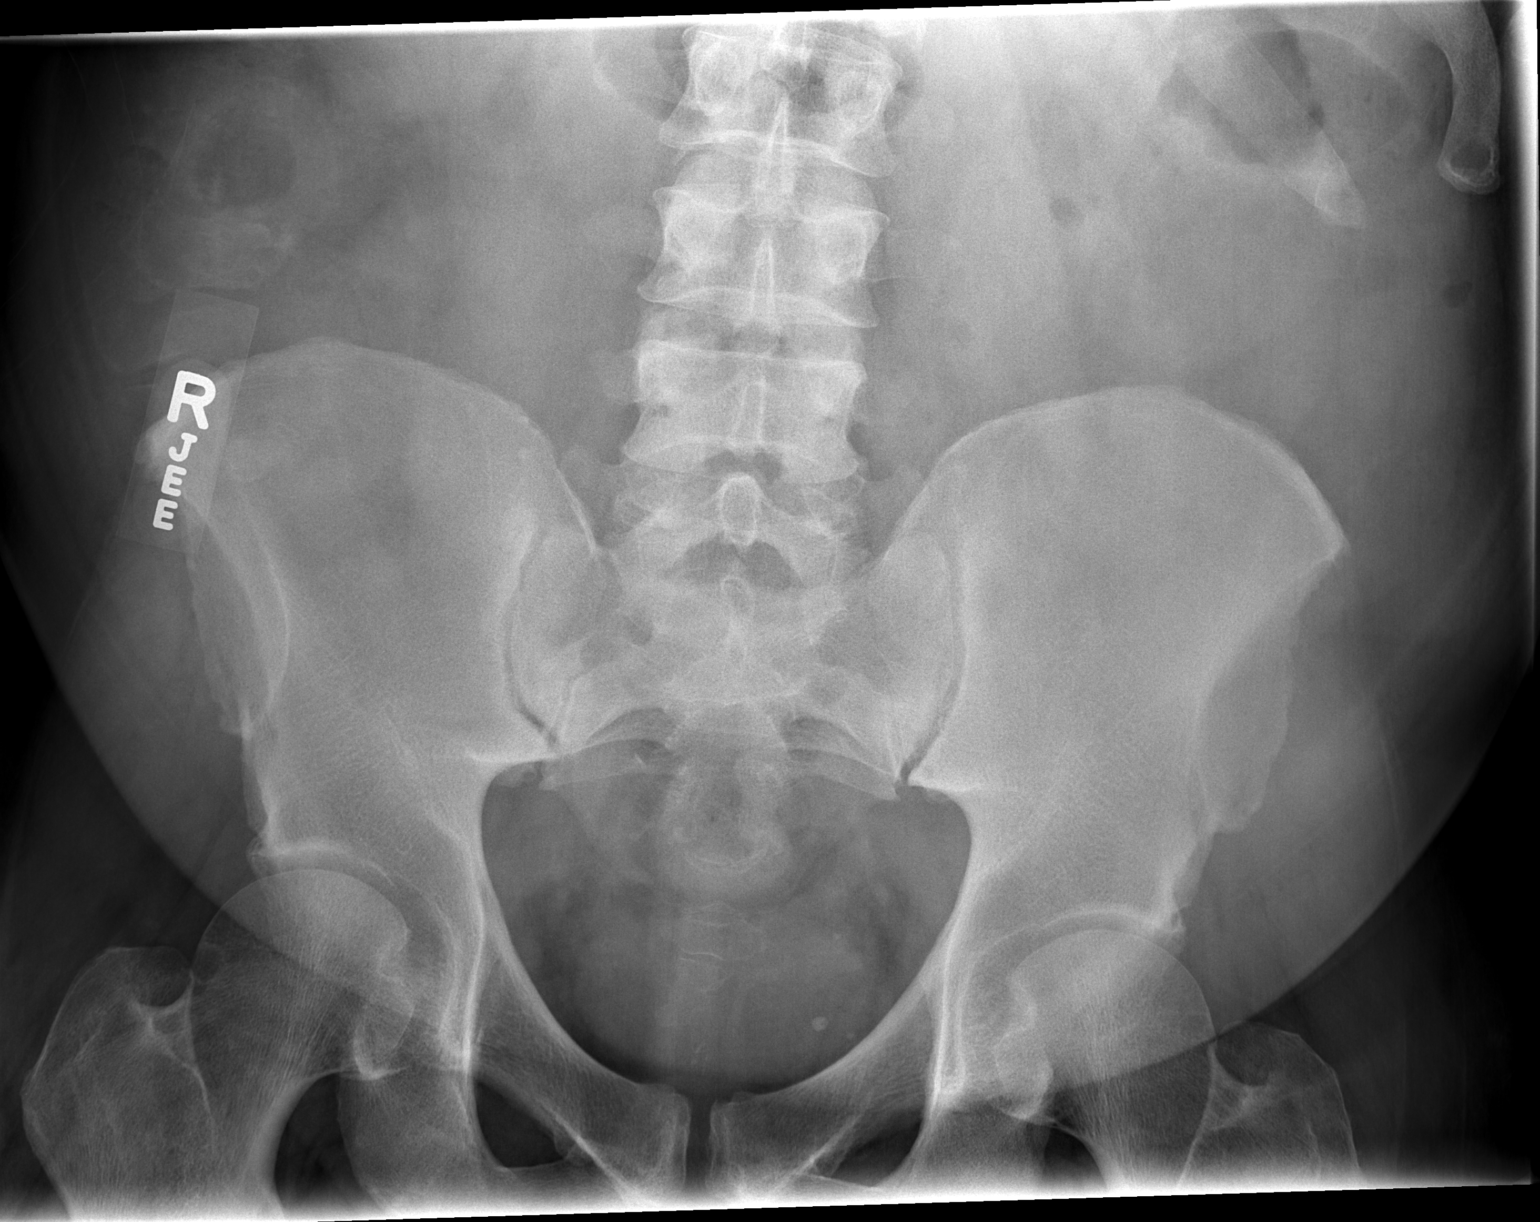

[2 of 2 positions shown; findings below may reference images not displayed]

FINDINGS: Normal bowel gas pattern.

No bowel dilatation or bowel wall thickening.

LEFT pelvic phleboliths stable.

No urinary tract calcification.

Osseous structures unremarkable.
IMPRESSION: Normal exam.
